# Patient Record
Sex: Female | Born: 1980 | Race: Black or African American | Hispanic: No | Marital: Married | Smoking: Former smoker
Health system: Southern US, Community
[De-identification: ages and names within clinical notes are randomized; demographics above are authoritative.]

## PROBLEM LIST (undated history)

## (undated) ENCOUNTER — Inpatient Hospital Stay (HOSPITAL_COMMUNITY): Payer: Self-pay

## (undated) DIAGNOSIS — O24419 Gestational diabetes mellitus in pregnancy, unspecified control: Secondary | ICD-10-CM

## (undated) DIAGNOSIS — K5100 Ulcerative (chronic) pancolitis without complications: Secondary | ICD-10-CM

## (undated) DIAGNOSIS — K529 Noninfective gastroenteritis and colitis, unspecified: Secondary | ICD-10-CM

## (undated) HISTORY — PX: FOOT SURGERY: SHX648

## (undated) HISTORY — PX: WISDOM TOOTH EXTRACTION: SHX21

## (undated) HISTORY — PX: THERAPEUTIC ABORTION: SHX798

---

## 2001-05-27 ENCOUNTER — Emergency Department (HOSPITAL_COMMUNITY): Admission: EM | Admit: 2001-05-27 | Discharge: 2001-05-27 | Payer: Self-pay

## 2016-09-19 ENCOUNTER — Encounter (HOSPITAL_COMMUNITY): Payer: Self-pay | Admitting: *Deleted

## 2016-09-19 ENCOUNTER — Inpatient Hospital Stay (HOSPITAL_COMMUNITY)
Admission: AD | Admit: 2016-09-19 | Discharge: 2016-09-19 | Disposition: A | Discharge status: Home / Self Care | Payer: Medicaid Other | Source: Ambulatory Visit | Attending: Obstetrics & Gynecology | Admitting: Obstetrics & Gynecology

## 2016-09-19 DIAGNOSIS — O219 Vomiting of pregnancy, unspecified: Secondary | ICD-10-CM | POA: Diagnosis present

## 2016-09-19 DIAGNOSIS — Z3A08 8 weeks gestation of pregnancy: Secondary | ICD-10-CM | POA: Insufficient documentation

## 2016-09-19 DIAGNOSIS — Z3201 Encounter for pregnancy test, result positive: Secondary | ICD-10-CM | POA: Insufficient documentation

## 2016-09-19 LAB — URINALYSIS, ROUTINE W REFLEX MICROSCOPIC
BILIRUBIN URINE: NEGATIVE
Glucose, UA: NEGATIVE mg/dL
Hgb urine dipstick: NEGATIVE
Ketones, ur: 15 mg/dL — AB
Leukocytes, UA: NEGATIVE
NITRITE: NEGATIVE
PROTEIN: NEGATIVE mg/dL
pH: 6 (ref 5.0–8.0)

## 2016-09-19 LAB — POCT PREGNANCY, URINE: PREG TEST UR: POSITIVE — AB

## 2016-09-19 MED ORDER — PROMETHAZINE HCL 25 MG PO TABS: 25.0000 mg | ORAL_TABLET | Freq: Four times a day (QID) | ORAL | 1 refills | Status: DC | PRN

## 2016-09-19 MED ORDER — PROMETHAZINE HCL 25 MG/ML IJ SOLN
25.0000 mg | Freq: Once | INTRAMUSCULAR | Status: AC
  Administered 2016-09-19: 25 mg via INTRAVENOUS
  Filled 2016-09-19: qty 1

## 2016-09-19 MED ORDER — DEXTROSE IN LACTATED RINGERS 5 % IV SOLN
Freq: Once | INTRAVENOUS | Status: AC
  Administered 2016-09-19: 18:00:00 via INTRAVENOUS

## 2016-09-19 NOTE — MAU Provider Note (Signed)
History     CSN: [id number]  Arrival date and time: 09/19/16 1609   None     Chief Complaint  Patient presents with  . Nausea   Subjective:   This 35 y.o. female presents with nausea and vomiting of undigested food.  Onset of symptoms was gradual starting 3 weeks ago with gradually worsening course since that time. Symptoms have been occuring  all day.  She has been trying to eat light meals but she still has nausea and vomiting. She has vomited 12 times in the past 24 hours.  Hasn't been able to keep water down.   There are no active problems to display for this patient.  Past Medical History: No date: Medical history non-contributory  Past Surgical History: No date: FOOT SURGERY No date: THERAPEUTIC ABORTION No date: WISDOM TOOTH EXTRACTION  No prescriptions prior to admission.  No Known Allergies  Smoking status: Never Smoker                                                            Smokeless tobacco: Never Used                     Alcohol use: No             Review of patient's family history indicates:   Diabetes                       Mother                   Diabetes                       Maternal Grandmother     Diabetes                       Maternal Grandfather     Objective:  Patient Vitals in the past 8 hrs: 09/19/16 1725, BP:126/75, Temp:98.4 F (36.9 C), Temp WUJ:WJXBsrc:Oral, Pulse:85, Resp:17 09/19/16 1619, BP:128/82, Temp:98.2 F (36.8 C), Temp JYN:WGNFsrc:Oral, Pulse:107, Resp:16, Height:5\' 2"  (1.575 m), Weight:86.3 kg (190 lb 3.2 oz    OB History    Gravida Para Term Preterm AB Living   4 1 1   2 1    SAB TAB Ectopic Multiple Live Births   1 1     1       Past Medical History:  Diagnosis Date  . Medical history non-contributory     Past Surgical History:  Procedure Laterality Date  . FOOT SURGERY    . THERAPEUTIC ABORTION    . WISDOM TOOTH EXTRACTION      Family History  Problem Relation Age of Onset  . Diabetes Mother   . Diabetes Maternal  Grandmother   . Diabetes Maternal Grandfather     Social History  Substance Use Topics  . Smoking status: Never Smoker  . Smokeless tobacco: Never Used  . Alcohol use No    Allergies: No Known Allergies  No prescriptions prior to admission.    Review of Systems  Constitutional: Negative.   HENT: Negative.   Eyes: Negative.   Respiratory: Negative.   Cardiovascular: Negative.   Gastrointestinal: Positive for heartburn, nausea and vomiting. Negative for abdominal pain, blood in stool,  constipation, diarrhea and melena.  Genitourinary: Negative.   Musculoskeletal: Negative.   Skin: Negative.   Neurological: Negative.   Endo/Heme/Allergies: Negative.    Physical Exam   Blood pressure 126/75, pulse 85, temperature 98.4 F (36.9 C), temperature source Oral, resp. rate 17, height 5\' 2"  (1.575 m), weight 86.3 kg (190 lb 3.2 oz), last menstrual period 07/15/2016.  Physical Exam  Constitutional: She is oriented to person, place, and time. She appears well-developed.  HENT:  Head: Normocephalic and atraumatic.  Neck: Normal range of motion.  Respiratory: Effort normal.  GI: Soft. Bowel sounds are normal. She exhibits no distension and no mass. There is no tenderness. There is no rebound and no guarding.  Musculoskeletal: Normal range of motion.  Neurological: She is alert and oriented to person, place, and time.  Skin: Skin is warm and dry.    MAU Course  Procedures  MDM IV fluids Phenergan   Assessment and Plan  Patient to be discharged home with N/V of pregnancy and recommendations for OTC treatments as well as phenergan prescription.    Charlesetta GaribaldiKathryn Lorraine Vernida Mcnicholas CNM 09/19/2016, 7:25 PM

## 2016-09-19 NOTE — MAU Note (Signed)
Is about 10wks preg.  Has real bad nausea.  [facility], first appt is 12/27

## 2016-09-19 NOTE — Discharge Instructions (Signed)

## 2016-10-17 ENCOUNTER — Ambulatory Visit (INDEPENDENT_AMBULATORY_CARE_PROVIDER_SITE_OTHER): Payer: Medicaid Other | Admitting: Obstetrics and Gynecology

## 2016-10-17 ENCOUNTER — Encounter: Payer: Self-pay | Admitting: *Deleted

## 2016-10-17 ENCOUNTER — Other Ambulatory Visit (HOSPITAL_COMMUNITY)
Admission: RE | Admit: 2016-10-17 | Discharge: 2016-10-17 | Disposition: A | Discharge status: Home / Self Care | Payer: Medicaid Other | Source: Ambulatory Visit | Attending: Obstetrics and Gynecology | Admitting: Obstetrics and Gynecology

## 2016-10-17 DIAGNOSIS — Z1151 Encounter for screening for human papillomavirus (HPV): Secondary | ICD-10-CM | POA: Diagnosis not present

## 2016-10-17 DIAGNOSIS — Z01419 Encounter for gynecological examination (general) (routine) without abnormal findings: Secondary | ICD-10-CM | POA: Insufficient documentation

## 2016-10-17 DIAGNOSIS — Z3491 Encounter for supervision of normal pregnancy, unspecified, first trimester: Secondary | ICD-10-CM

## 2016-10-17 DIAGNOSIS — Z3490 Encounter for supervision of normal pregnancy, unspecified, unspecified trimester: Secondary | ICD-10-CM | POA: Insufficient documentation

## 2016-10-17 MED ORDER — PREPLUS 27-1 MG PO TABS: 1.0000 | ORAL_TABLET | Freq: Every day | ORAL | 13 refills | Status: DC

## 2016-10-17 NOTE — Addendum Note (Signed)
Addended by: Maretta BeesMCGLASHAN, Allyce Bochicchio J on: 10/17/2016 01:57 PM   Modules accepted: Orders

## 2016-10-17 NOTE — Addendum Note (Signed)
Addended by: Marya LandryFOSTER, Pier Laux D on: 10/17/2016 02:01 PM   Modules accepted: Orders

## 2016-10-17 NOTE — Patient Instructions (Signed)
First Trimester of Pregnancy  The first trimester of pregnancy is from week 1 until the end of week 12 (months 1 through 3). A week after a sperm fertilizes an egg, the egg will implant on the wall of the uterus. This embryo will begin to develop into a baby. Genes from you and your partner are forming the baby. The female genes determine whether the baby is a boy or a girl. At 6-8 weeks, the eyes and face are formed, and the heartbeat can be seen on ultrasound. At the end of 12 weeks, all the baby's organs are formed.   Now that you are pregnant, you will want to do everything you can to have a healthy baby. Two of the most important things are to get good prenatal care and to follow your health care provider's instructions. Prenatal care is all the medical care you receive before the baby's birth. This care will help prevent, find, and treat any problems during the pregnancy and childbirth.  BODY CHANGES  Your body goes through many changes during pregnancy. The changes vary from woman to woman.   · You may gain or lose a couple of pounds at first.  · You may feel sick to your stomach (nauseous) and throw up (vomit). If the vomiting is uncontrollable, call your health care provider.  · You may tire easily.  · You may develop headaches that can be relieved by medicines approved by your health care provider.  · You may urinate more often. Painful urination may mean you have a bladder infection.  · You may develop heartburn as a result of your pregnancy.  · You may develop constipation because certain hormones are causing the muscles that push waste through your intestines to slow down.  · You may develop hemorrhoids or swollen, bulging veins (varicose veins).  · Your breasts may begin to grow larger and become tender. Your nipples may stick out more, and the tissue that surrounds them (areola) may become darker.  · Your gums may bleed and may be sensitive to brushing and flossing.   · Dark spots or blotches (chloasma, mask of pregnancy) may develop on your face. This will likely fade after the baby is born.  · Your menstrual periods will stop.  · You may have a loss of appetite.  · You may develop cravings for certain kinds of food.  · You may have changes in your emotions from day to day, such as being excited to be pregnant or being concerned that something may go wrong with the pregnancy and baby.  · You may have more vivid and strange dreams.  · You may have changes in your hair. These can include thickening of your hair, rapid growth, and changes in texture. Some women also have hair loss during or after pregnancy, or hair that feels dry or thin. Your hair will most likely return to normal after your baby is born.  WHAT TO EXPECT AT YOUR PRENATAL VISITS  During a routine prenatal visit:  · You will be weighed to make sure you and the baby are growing normally.  · Your blood pressure will be taken.  · Your abdomen will be measured to track your baby's growth.  · The fetal heartbeat will be listened to starting around week 10 or 12 of your pregnancy.  · Test results from any previous visits will be discussed.  Your health care provider may ask you:  · How you are feeling.  · If you   are feeling the baby move.  · If you have had any abnormal symptoms, such as leaking fluid, bleeding, severe headaches, or abdominal cramping.  · If you are using any tobacco products, including cigarettes, chewing tobacco, and electronic cigarettes.  · If you have any questions.  Other tests that may be performed during your first trimester include:  · Blood tests to find your blood type and to check for the presence of any previous infections. They will also be used to check for low iron levels (anemia) and Rh antibodies. Later in the pregnancy, blood tests for diabetes will be done along with other tests if problems develop.  · Urine tests to check for infections, diabetes, or protein in the urine.   · An ultrasound to confirm the proper growth and development of the baby.  · An amniocentesis to check for possible genetic problems.  · Fetal screens for spina bifida and Down syndrome.  · You may need other tests to make sure you and the baby are doing well.  · HIV (human immunodeficiency virus) testing. Routine prenatal testing includes screening for HIV, unless you choose not to have this test.  HOME CARE INSTRUCTIONS   Medicines  · Follow your health care provider's instructions regarding medicine use. Specific medicines may be either safe or unsafe to take during pregnancy.  · Take your prenatal vitamins as directed.  · If you develop constipation, try taking a stool softener if your health care provider approves.  Diet  · Eat regular, well-balanced meals. Choose a variety of foods, such as meat or vegetable-based protein, fish, milk and low-fat dairy products, vegetables, fruits, and whole grain breads and cereals. Your health care provider will help you determine the amount of weight gain that is right for you.  · Avoid raw meat and uncooked cheese. These carry germs that can cause birth defects in the baby.  · Eating four or five small meals rather than three large meals a day may help relieve nausea and vomiting. If you start to feel nauseous, eating a few soda crackers can be helpful. Drinking liquids between meals instead of during meals also seems to help nausea and vomiting.  · If you develop constipation, eat more high-fiber foods, such as fresh vegetables or fruit and whole grains. Drink enough fluids to keep your urine clear or pale yellow.  Activity and Exercise  · Exercise only as directed by your health care provider. Exercising will help you:    Control your weight.    Stay in shape.    Be prepared for labor and delivery.  · Experiencing pain or cramping in the lower abdomen or low back is a good sign that you should stop exercising. Check with your health care provider  before continuing normal exercises.  · Try to avoid standing for long periods of time. Move your legs often if you must stand in one place for a long time.  · Avoid heavy lifting.  · Wear low-heeled shoes, and practice good posture.  · You may continue to have sex unless your health care provider directs you otherwise.  Relief of Pain or Discomfort  · Wear a good support bra for breast tenderness.    · Take warm sitz baths to soothe any pain or discomfort caused by hemorrhoids. Use hemorrhoid cream if your health care provider approves.    · Rest with your legs elevated if you have leg cramps or low back pain.  · If you develop varicose veins in your   legs, wear support hose. Elevate your feet for 15 minutes, 3-4 times a day. Limit salt in your diet.  Prenatal Care  · Schedule your prenatal visits by the twelfth week of pregnancy. They are usually scheduled monthly at first, then more often in the last 2 months before delivery.  · Write down your questions. Take them to your prenatal visits.  · Keep all your prenatal visits as directed by your health care provider.  Safety  · Wear your seat belt at all times when driving.  · Make a list of emergency phone numbers, including numbers for family, friends, the hospital, and police and fire departments.  General Tips  · Ask your health care provider for a referral to a local prenatal education class. Begin classes no later than at the beginning of month 6 of your pregnancy.  · Ask for help if you have counseling or nutritional needs during pregnancy. Your health care provider can offer advice or refer you to specialists for help with various needs.  · Do not use hot tubs, steam rooms, or saunas.  · Do not douche or use tampons or scented sanitary pads.  · Do not cross your legs for long periods of time.  · Avoid cat litter boxes and soil used by cats. These carry germs that can cause birth defects in the baby and possibly loss of the fetus by miscarriage or stillbirth.   · Avoid all smoking, herbs, alcohol, and medicines not prescribed by your health care provider. Chemicals in these affect the formation and growth of the baby.  · Do not use any tobacco products, including cigarettes, chewing tobacco, and electronic cigarettes. If you need help quitting, ask your health care provider. You may receive counseling support and other resources to help you quit.  · Schedule a dentist appointment. At home, brush your teeth with a soft toothbrush and be gentle when you floss.  SEEK MEDICAL CARE IF:   · You have dizziness.  · You have mild pelvic cramps, pelvic pressure, or nagging pain in the abdominal area.  · You have persistent nausea, vomiting, or diarrhea.  · You have a bad smelling vaginal discharge.  · You have pain with urination.  · You notice increased swelling in your face, hands, legs, or ankles.  SEEK IMMEDIATE MEDICAL CARE IF:   · You have a fever.  · You are leaking fluid from your vagina.  · You have spotting or bleeding from your vagina.  · You have severe abdominal cramping or pain.  · You have rapid weight gain or loss.  · You vomit blood or material that looks like coffee grounds.  · You are exposed to German measles and have never had them.  · You are exposed to fifth disease or chickenpox.  · You develop a severe headache.  · You have shortness of breath.  · You have any kind of trauma, such as from a fall or a car accident.     This information is not intended to replace advice given to you by your health care provider. Make sure you discuss any questions you have with your health care provider.     Document Released: 10/02/2001 Document Revised: 10/29/2014 Document Reviewed: 08/18/2013  Elsevier Interactive Patient Education ©2017 [employer]

## 2016-10-17 NOTE — Progress Notes (Signed)
Subjective:  Veronica Carroll is a 35 y.o. G4P1021 at 10557w3d being seen today for her first OB visit. Dates by LMP. She has some URI Sx. N/V has improved. H/O Chronic [facility] Previously took, Gabapentin and Moxicam. Off these meds. Recommended not medications for back pain if possible during the pregnancy.   She is currently monitored for the following issues for this low-risk pregnancy and has Supervision of normal pregnancy, antepartum on her problem list.  Patient reports no complaints.  Contractions: Not present. Vag. Bleeding: None.  Movement: Absent. Denies leaking of fluid.   The following portions of the patient's history were reviewed and updated as appropriate: allergies, current medications, past family history, past medical history, past social history, past surgical history and problem list. Problem list updated.  Objective:   Vitals:   10/17/16 0838  BP: 130/79  Pulse: 96  Temp: 97 F (36.1 C)  Weight: 185 lb 6.4 oz (84.1 kg)    Fetal Status: Fetal Heart Rate (bpm): 154   Movement: Absent     General:  Alert, oriented and cooperative. Patient is in no acute distress.  Skin: Skin is warm and dry. No rash noted.   Cardiovascular: Normal heart rate noted  Respiratory: Normal respiratory effort, no problems with respiration noted  Abdomen: Soft, gravid, appropriate for gestational age. Pain/Pressure: Absent     Pelvic:  Cervical exam deferred        Extremities: Normal range of motion.     Mental Status: Normal mood and affect. Normal behavior. Normal judgment and thought content.  Breast: sym supple no nipple d/c masses or adenopathy  Urinalysis: Urine Protein: Trace Urine Glucose: Negative  Assessment and Plan:  Pregnancy: G4P1021 at 4657w3d  1. Encounter for supervision of normal pregnancy, antepartum, unspecified gravidity Prenatal care reviewed. Schedule anatomy scan at next visit Quad screen if desires at next visit Declines flu vaccine -  Culture, OB Urine - ToxASSURE Select 13 (MW), Urine - Cystic Fibrosis Mutation 97 - Hemoglobinopathy evaluation - Varicella zoster antibody, IgG - Obstetric Panel, Including HIV - Cytology - PAP - NuSwab Vaginitis Plus (VG+) - Comprehensive metabolic panel - HgB A1c  Preterm labor symptoms and general obstetric precautions including but not limited to vaginal bleeding, contractions, leaking of fluid and fetal movement were reviewed in detail with the patient. Please refer to After Visit Summary for other counseling recommendations.  Return in about 4 weeks (around 11/14/2016) for OB visit.   Hermina StaggersMichael L Ervin, MD

## 2016-10-18 LAB — COMPREHENSIVE METABOLIC PANEL
A/G RATIO: 1.3 (ref 1.2–2.2)
ALBUMIN: 3.8 g/dL (ref 3.5–5.5)
ALT: 6 IU/L (ref 0–32)
AST: 9 IU/L (ref 0–40)
Alkaline Phosphatase: 69 IU/L (ref 39–117)
BUN / CREAT RATIO: 6 — AB (ref 9–23)
BUN: 5 mg/dL — ABNORMAL LOW (ref 6–20)
Bilirubin Total: <0.2 mg/dL (ref 0.0–1.2)
CALCIUM: 8.8 mg/dL (ref 8.7–10.2)
CO2: 20 mmol/L (ref 18–29)
Chloride: 102 mmol/L (ref 96–106)
Creatinine, Ser: 0.82 mg/dL (ref 0.57–1.00)
GFR, EST AFRICAN AMERICAN: 106 mL/min/{1.73_m2} (ref >59)
GFR, EST NON AFRICAN AMERICAN: 92 mL/min/{1.73_m2} (ref >59)
GLOBULIN, TOTAL: 3 g/dL (ref 1.5–4.5)
Glucose: 104 mg/dL — ABNORMAL HIGH (ref 65–99)
POTASSIUM: 3.9 mmol/L (ref 3.5–5.2)
SODIUM: 139 mmol/L (ref 134–144)
TOTAL PROTEIN: 6.8 g/dL (ref 6.0–8.5)

## 2016-10-18 LAB — CYTOLOGY - PAP
Diagnosis: NEGATIVE
HPV (WINDOPATH): NOT DETECTED

## 2016-10-20 LAB — NUSWAB VAGINITIS PLUS (VG+)
ATOPOBIUM VAGINAE: HIGH {score} — AB
BVAB 2: HIGH {score} — AB
Candida albicans, NAA: NEGATIVE
Candida glabrata, NAA: NEGATIVE
Chlamydia trachomatis, NAA: NEGATIVE
Megasphaera 1: HIGH Score — AB
Neisseria gonorrhoeae, NAA: NEGATIVE
Trich vag by NAA: NEGATIVE

## 2016-10-21 ENCOUNTER — Inpatient Hospital Stay (HOSPITAL_COMMUNITY)
Admission: AD | Admit: 2016-10-21 | Discharge: 2016-10-21 | Disposition: A | Discharge status: Home / Self Care | Payer: Medicaid Other | Source: Ambulatory Visit | Attending: Obstetrics and Gynecology | Admitting: Obstetrics and Gynecology

## 2016-10-21 ENCOUNTER — Encounter (HOSPITAL_COMMUNITY): Payer: Self-pay

## 2016-10-21 DIAGNOSIS — Z3A14 14 weeks gestation of pregnancy: Secondary | ICD-10-CM | POA: Diagnosis not present

## 2016-10-21 DIAGNOSIS — E876 Hypokalemia: Secondary | ICD-10-CM

## 2016-10-21 DIAGNOSIS — J329 Chronic sinusitis, unspecified: Secondary | ICD-10-CM | POA: Diagnosis not present

## 2016-10-21 DIAGNOSIS — O210 Mild hyperemesis gravidarum: Secondary | ICD-10-CM | POA: Diagnosis present

## 2016-10-21 DIAGNOSIS — O219 Vomiting of pregnancy, unspecified: Secondary | ICD-10-CM | POA: Diagnosis not present

## 2016-10-21 DIAGNOSIS — O99512 Diseases of the respiratory system complicating pregnancy, second trimester: Secondary | ICD-10-CM | POA: Insufficient documentation

## 2016-10-21 DIAGNOSIS — O211 Hyperemesis gravidarum with metabolic disturbance: Secondary | ICD-10-CM | POA: Insufficient documentation

## 2016-10-21 DIAGNOSIS — E860 Dehydration: Secondary | ICD-10-CM

## 2016-10-21 LAB — URINALYSIS, ROUTINE W REFLEX MICROSCOPIC
BILIRUBIN URINE: NEGATIVE
Glucose, UA: NEGATIVE mg/dL
Hgb urine dipstick: NEGATIVE
KETONES UR: 80 mg/dL — AB
Nitrite: NEGATIVE
Protein, ur: 30 mg/dL — AB
SPECIFIC GRAVITY, URINE: 1.026 (ref 1.005–1.030)
pH: 5 (ref 5.0–8.0)

## 2016-10-21 LAB — COMPREHENSIVE METABOLIC PANEL
ALBUMIN: 3.2 g/dL — AB (ref 3.5–5.0)
ALT: 9 U/L — AB (ref 14–54)
AST: 14 U/L — AB (ref 15–41)
Alkaline Phosphatase: 61 U/L (ref 38–126)
Anion gap: 9 (ref 5–15)
BUN: 7 mg/dL (ref 6–20)
CHLORIDE: 104 mmol/L (ref 101–111)
CO2: 19 mmol/L — AB (ref 22–32)
CREATININE: 0.72 mg/dL (ref 0.44–1.00)
Calcium: 8.9 mg/dL (ref 8.9–10.3)
GFR calc Af Amer: >60 mL/min (ref >60)
GFR calc non Af Amer: >60 mL/min (ref >60)
GLUCOSE: 139 mg/dL — AB (ref 65–99)
Potassium: 2.9 mmol/L — ABNORMAL LOW (ref 3.5–5.1)
SODIUM: 132 mmol/L — AB (ref 135–145)
Total Bilirubin: 0.2 mg/dL — ABNORMAL LOW (ref 0.3–1.2)
Total Protein: 7 g/dL (ref 6.5–8.1)

## 2016-10-21 LAB — CBC
HCT: 31.9 % — ABNORMAL LOW (ref 36.0–46.0)
Hemoglobin: 10.8 g/dL — ABNORMAL LOW (ref 12.0–15.0)
MCH: 26.5 pg (ref 26.0–34.0)
MCHC: 33.9 g/dL (ref 30.0–36.0)
MCV: 78.2 fL (ref 78.0–100.0)
PLATELETS: 246 10*3/uL (ref 150–400)
RBC: 4.08 MIL/uL (ref 3.87–5.11)
RDW: 13.8 % (ref 11.5–15.5)
WBC: 5.5 10*3/uL (ref 4.0–10.5)

## 2016-10-21 MED ORDER — POTASSIUM CHLORIDE 20 MEQ PO PACK
20.0000 meq | PACK | Freq: Once | ORAL | Status: DC
  Filled 2016-10-21: qty 1

## 2016-10-21 MED ORDER — POTASSIUM CHLORIDE 20 MEQ/15ML (10%) PO SOLN
20.0000 meq | Freq: Once | ORAL | Status: AC
Start: 2016-10-21 — End: 2016-10-21
  Administered 2016-10-21: 20 meq via ORAL
  Filled 2016-10-21: qty 15

## 2016-10-21 MED ORDER — AMOXICILLIN-POT CLAVULANATE 875-125 MG PO TABS: 1.0000 | ORAL_TABLET | Freq: Two times a day (BID) | ORAL | 0 refills | Status: DC

## 2016-10-21 MED ORDER — SODIUM CHLORIDE 0.9 % IV SOLN
8.0000 mg | Freq: Once | INTRAVENOUS | Status: AC
  Administered 2016-10-21: 8 mg via INTRAVENOUS
  Filled 2016-10-21: qty 4

## 2016-10-21 MED ORDER — DEXTROSE 5 % IN LACTATED RINGERS IV BOLUS
1000.0000 mL | Freq: Once | INTRAVENOUS | Status: AC
  Administered 2016-10-21: 1000 mL via INTRAVENOUS

## 2016-10-21 MED ORDER — POTASSIUM CHLORIDE ER 10 MEQ PO TBCR: 10.0000 meq | EXTENDED_RELEASE_TABLET | Freq: Two times a day (BID) | ORAL | 0 refills | Status: DC

## 2016-10-21 MED ORDER — ONDANSETRON 4 MG PO TBDP: 4.0000 mg | ORAL_TABLET | Freq: Three times a day (TID) | ORAL | 0 refills | Status: DC | PRN

## 2016-10-21 NOTE — MAU Note (Signed)
Patient presents with vomiting the entire 1st trimester, today had blood in bowel movement, onset of congestion since Christmas has productive sputum yellow to green.

## 2016-10-21 NOTE — MAU Provider Note (Signed)
History     CSN: [id number]  Arrival date and time: 10/21/16 1454   First Provider Initiated Contact with Patient 10/21/16 1624      Chief Complaint  Patient presents with  . Facial Pain  . Morning Sickness  . Nasal Congestion   HPI  Veronica Carroll is a 35 y.o. G4P1021 at 4252w0d who presents with n/v, sinus pain, & congestion.  Nausea & vomiting has been ongoing during pregnancy. Patient has phenergan at home. Last took yesterday. Has vomited 4 times today; mostly bile with some streaks of blood today. Denies diarrhea, constipation, abdominal pain, or heartburn.  Congestion & sinus pain since Christmas Day. Reports rhinorrhea, nasal congestion, sinus pain. Has noted thick yellow mucous when she blows her nose. PND is exacerbating nausea. Denies fever/chills, sore throat, ear pain, or cough. Has taken sudafed without relief. No sick contacts.   OB History    Gravida Para Term Preterm AB Living   4 1 1   2 1    SAB TAB Ectopic Multiple Live Births   1 1     1       Past Medical History:  Diagnosis Date  . Medical history non-contributory     Past Surgical History:  Procedure Laterality Date  . FOOT SURGERY    . THERAPEUTIC ABORTION    . WISDOM TOOTH EXTRACTION      Family History  Problem Relation Age of Onset  . Diabetes Mother   . Diabetes Maternal Grandmother   . Diabetes Maternal Grandfather     Social History  Substance Use Topics  . Smoking status: Never Smoker  . Smokeless tobacco: Never Used  . Alcohol use No    Allergies: No Known Allergies  Prescriptions Prior to Admission  Medication Sig Dispense Refill Last Dose  . Prenatal Vit-Fe Fumarate-FA (PREPLUS) 27-1 MG TABS Take 1 tablet by mouth daily. 30 tablet 13   . promethazine (PHENERGAN) 25 MG tablet Take 1 tablet (25 mg total) by mouth every 6 (six) hours as needed for nausea or vomiting. 30 tablet 1 Taking    Review of Systems  Constitutional: Negative for chills and fever.  HENT: Positive for  congestion and sinus pain. Negative for ear pain and sore throat.   Respiratory: Negative for cough.   Gastrointestinal: Positive for nausea and vomiting. Negative for abdominal pain, constipation and diarrhea.  Genitourinary: Negative.   Musculoskeletal: Negative for neck pain.  Neurological: Positive for headaches.   Physical Exam   Blood pressure 121/78, pulse 75, temperature 99.4 F (37.4 C), temperature source Oral, resp. rate 18, height 5\' 1"  (1.549 m), weight 186 lb (84.4 kg), last menstrual period 07/15/2016.  Physical Exam  Nursing note and vitals reviewed. Constitutional: She is oriented to person, place, and time. She appears well-developed and well-nourished. No distress.  HENT:  Head: Normocephalic and atraumatic.  Right Ear: Tympanic membrane normal.  Left Ear: Tympanic membrane normal.  Nose: Mucosal edema and rhinorrhea present. Right sinus exhibits maxillary sinus tenderness. Right sinus exhibits no frontal sinus tenderness. Left sinus exhibits maxillary sinus tenderness. Left sinus exhibits no frontal sinus tenderness.  Mouth/Throat: Oropharynx is clear and moist.  PND noted in posterior oropharynx  Eyes: Conjunctivae are normal. Right eye exhibits no discharge. Left eye exhibits no discharge. No scleral icterus.  Neck: Normal range of motion.  Cardiovascular: Normal rate, regular rhythm and normal heart sounds.   No murmur heard. Respiratory: Effort normal and breath sounds normal. No respiratory distress. She has no wheezes.  GI: Soft.  Neurological: She is alert and oriented to person, place, and time.  Skin: Skin is warm and dry. She is not diaphoretic.  Psychiatric: She has a normal mood and affect. Her behavior is normal. Judgment and thought content normal.    MAU Course  Procedures Results for orders placed or performed during the hospital encounter of 10/21/16 (from the past 24 hour(s))  Urinalysis, Routine w reflex microscopic     Status: Abnormal    Collection Time: 10/21/16  3:12 PM  Result Value Ref Range   Color, Urine YELLOW YELLOW   APPearance HAZY (A) CLEAR   Specific Gravity, Urine 1.026 1.005 - 1.030   pH 5.0 5.0 - 8.0   Glucose, UA NEGATIVE NEGATIVE mg/dL   Hgb urine dipstick NEGATIVE NEGATIVE   Bilirubin Urine NEGATIVE NEGATIVE   Ketones, ur 80 (A) NEGATIVE mg/dL   Protein, ur 30 (A) NEGATIVE mg/dL   Nitrite NEGATIVE NEGATIVE   Leukocytes, UA TRACE (A) NEGATIVE   RBC / HPF 0-5 0 - 5 RBC/hpf   WBC, UA 6-30 0 - 5 WBC/hpf   Bacteria, UA RARE (A) NONE SEEN   Squamous Epithelial / LPF 6-30 (A) NONE SEEN   Mucous PRESENT   CBC     Status: Abnormal   Collection Time: 10/21/16  5:06 PM  Result Value Ref Range   WBC 5.5 4.0 - 10.5 K/uL   RBC 4.08 3.87 - 5.11 MIL/uL   Hemoglobin 10.8 (L) 12.0 - 15.0 g/dL   HCT 82.931.9 (L) 56.236.0 - 13.046.0 %   MCV 78.2 78.0 - 100.0 fL   MCH 26.5 26.0 - 34.0 pg   MCHC 33.9 30.0 - 36.0 g/dL   RDW 86.513.8 78.411.5 - 69.615.5 %   Platelets 246 150 - 400 K/uL  Comprehensive metabolic panel     Status: Abnormal   Collection Time: 10/21/16  5:06 PM  Result Value Ref Range   Sodium 132 (L) 135 - 145 mmol/L   Potassium 2.9 (L) 3.5 - 5.1 mmol/L   Chloride 104 101 - 111 mmol/L   CO2 19 (L) 22 - 32 mmol/L   Glucose, Bld 139 (H) 65 - 99 mg/dL   BUN 7 6 - 20 mg/dL   Creatinine, Ser 2.950.72 0.44 - 1.00 mg/dL   Calcium 8.9 8.9 - 28.410.3 mg/dL   Total Protein 7.0 6.5 - 8.1 g/dL   Albumin 3.2 (L) 3.5 - 5.0 g/dL   AST 14 (L) 15 - 41 U/L   ALT 9 (L) 14 - 54 U/L   Alkaline Phosphatase 61 38 - 126 U/L   Total Bilirubin 0.2 (L) 0.3 - 1.2 mg/dL   GFR calc non Af Amer >60 >60 mL/min   GFR calc Af Amer >60 >60 mL/min   Anion gap 9 5 - 15    MDM FHT 150 by doppler U/a shows 80 ketones --- IV hydration with D5LR Zofran 8 mg IV CBC, CMP -- hypokalemia with a K of 2.9 -- 20 meq of kdur given in MAU -- will send home with course of kdur Able to tolerate POs  Assessment and Plan  A; 1. Nausea and vomiting during pregnancy  prior to [redacted] weeks gestation   2. Dehydration   3. Hypokalemia   4. Rhinosinusitis    P: Discharge home Rx zofran, kdur, & augmentin Keep f/u with OB Discussed use of OTC meds for symptoms -- recommend cetirizine & fluticasone Discussed reasons to return to MAU  Judeth HornErin Zayden Maffei 10/21/2016, 4:24 PM

## 2016-10-21 NOTE — Discharge Instructions (Signed)
Sinusitis, Adult Sinusitis is soreness and inflammation of your sinuses. Sinuses are hollow spaces in the bones around your face. They are located:  Around your eyes.  In the middle of your forehead.  Behind your nose.  In your cheekbones. Your sinuses and nasal passages are lined with a stringy fluid (mucus). Mucus normally drains out of your sinuses. When your nasal tissues get inflamed or swollen, the mucus can get trapped or blocked so air cannot flow through your sinuses. This lets bacteria, viruses, and funguses grow, and that leads to infection. Follow these instructions at home: Medicines  Take, use, or apply over-the-counter and prescription medicines only as told by your doctor. These may include nasal sprays.  If you were prescribed an antibiotic medicine, take it as told by your doctor. Do not stop taking the antibiotic even if you start to feel better. Hydrate and Humidify  Drink enough water to keep your pee (urine) clear or pale yellow.  Use a cool mist humidifier to keep the humidity level in your home above 50%.  Breathe in steam for 10-15 minutes, 3-4 times a day or as told by your doctor. You can do this in the bathroom while a hot shower is running.  Try not to spend time in cool or dry air. Rest  Rest as much as possible.  Sleep with your head raised (elevated).  Make sure to get enough sleep each night. General instructions  Put a warm, moist washcloth on your face 3-4 times a day or as told by your doctor. This will help with discomfort.  Wash your hands often with soap and water. If there is no soap and water, use hand sanitizer.  Do not smoke. Avoid being around people who are smoking (secondhand smoke).  Keep all follow-up visits as told by your doctor. This is important. Contact a doctor if:  You have a fever.  Your symptoms get worse.  Your symptoms do not get better within 10 days. Get help right away if:  You have a very bad  headache.  You cannot stop throwing up (vomiting).  You have pain or swelling around your face or eyes.  You have trouble seeing.  You feel confused.  Your neck is stiff.  You have trouble breathing. This information is not intended to replace advice given to you by your health care provider. Make sure you discuss any questions you have with your health care provider. Document Released: 03/26/2008 Document Revised: 06/03/2016 Document Reviewed: 08/03/2015 Elsevier Interactive Patient Education  2017 [employer] Hypokalemia Hypokalemia means that the amount of potassium in the blood is lower than normal.Potassium is a chemical that helps regulate the amount of fluid in the body (electrolyte). It also stimulates muscle tightening (contraction) and helps nerves work properly.Normally, most of the bodys potassium is inside of cells, and only a very small amount is in the blood. Because the amount in the blood is so small, minor changes to potassium levels in the blood can be life-threatening. What are the causes? This condition may be caused by:  Antibiotic medicine.  Diarrhea or vomiting. Taking too much of a medicine that helps you have a bowel movement (laxative) can cause diarrhea and lead to hypokalemia.  Chronic kidney disease (CKD).  Medicines that help the body get rid of excess fluid (diuretics).  Eating disorders, such as bulimia.  Low magnesium levels in the body.  Sweating a lot. What are the signs or symptoms? Symptoms of this condition include:  Weakness.  Constipation.  Fatigue.  Muscle cramps.  Mental confusion.  Skipped heartbeats or irregular heartbeat (palpitations).  Tingling or numbness. How is this diagnosed? This condition is diagnosed with a blood test. How is this treated? Hypokalemia can be treated by taking potassium supplements by mouth or adjusting the medicines that you take. Treatment may also include eating more foods that contain  a lot of potassium. If your potassium level is very low, you may need to get potassium through an IV tube in one of your veins and be monitored in the hospital. Follow these instructions at home:  Take over-the-counter and prescription medicines only as told by your health care provider. This includes vitamins and supplements.  Eat a healthy diet. A healthy diet includes fresh fruits and vegetables, whole grains, healthy fats, and lean proteins.  If instructed, eat more foods that contain a lot of potassium, such as:  Nuts, such as peanuts and pistachios.  Seeds, such as sunflower seeds and pumpkin seeds.  Peas, lentils, and lima beans.  Whole grain and bran cereals and breads.  Fresh fruits and vegetables, such as apricots, avocado, bananas, cantaloupe, kiwi, oranges, tomatoes, asparagus, and potatoes.  Orange juice.  Tomato juice.  Red meats.  Yogurt.  Keep all follow-up visits as told by your health care provider. This is important. Contact a health care provider if:  You have weakness that gets worse.  You feel your heart pounding or racing.  You vomit.  You have diarrhea.  You have diabetes (diabetes mellitus) and you have trouble keeping your blood sugar (glucose) in your target range. Get help right away if:  You have chest pain.  You have shortness of breath.  You have vomiting or diarrhea that lasts for more than 2 days.  You faint. This information is not intended to replace advice given to you by your health care provider. Make sure you discuss any questions you have with your health care provider. Document Released: 10/08/2005 Document Revised: 05/26/2016 Document Reviewed: 05/26/2016 Elsevier Interactive Patient Education  2017 [employer]. Morning Sickness Morning sickness is when you feel sick to your stomach (nauseous) during pregnancy. This nauseous feeling may or may not come with vomiting. It often occurs in the morning but can be a problem any  time of day. Morning sickness is most common during the first trimester, but it may continue throughout pregnancy. While morning sickness is unpleasant, it is usually harmless unless you develop severe and continual vomiting (hyperemesis gravidarum). This condition requires more intense treatment. What are the causes? The cause of morning sickness is not completely known but seems to be related to normal hormonal changes that occur in pregnancy. What increases the risk? You are at greater risk if you:  Experienced nausea or vomiting before your pregnancy.  Had morning sickness during a previous pregnancy.  Are pregnant with more than one baby, such as twins. How is this treated? Do not use any medicines (prescription, over-the-counter, or herbal) for morning sickness without first talking to your health care provider. Your health care provider may prescribe or recommend:  Vitamin B6 supplements.  Anti-nausea medicines.  The herbal medicine ginger. Follow these instructions at home:  Only take over-the-counter or prescription medicines as directed by your health care provider.  Taking multivitamins before getting pregnant can prevent or decrease the severity of morning sickness in most women.  Eat a piece of dry toast or unsalted crackers before getting out of bed in the morning.  Eat five or six small  meals a day.  Eat dry and bland foods (rice, baked potato). Foods high in carbohydrates are often helpful.  Do not drink liquids with your meals. Drink liquids between meals.  Avoid greasy, fatty, and spicy foods.  Get someone to cook for you if the smell of any food causes nausea and vomiting.  If you feel nauseous after taking prenatal vitamins, take the vitamins at night or with a snack.  Snack on protein foods (nuts, yogurt, cheese) between meals if you are hungry.  Eat unsweetened gelatins for desserts.  Wearing an acupressure wristband (worn for sea sickness) may be  helpful.  Acupuncture may be helpful.  Do not smoke.  Get a humidifier to keep the air in your house free of odors.  Get plenty of fresh air. Contact a health care provider if:  Your home remedies are not working, and you need medicine.  You feel dizzy or lightheaded.  You are losing weight. Get help right away if:  You have persistent and uncontrolled nausea and vomiting.  You pass out (faint). This information is not intended to replace advice given to you by your health care provider. Make sure you discuss any questions you have with your health care provider. Document Released: 11/29/2006 Document Revised: 03/15/2016 Document Reviewed: 03/25/2013 Elsevier Interactive Patient Education  2017 [employer].

## 2016-10-22 LAB — URINE CULTURE, OB REFLEX

## 2016-10-22 LAB — CULTURE, OB URINE

## 2016-10-22 NOTE — L&D Delivery Note (Signed)
Delivery Note At 6:07 AM a viable female was delivered via  (Presentation: vertex ROA  ).  APGAR:9 ,9 ; weight  pending.   Placenta status: delivered intact with gentle traction.  Cord: 3 vessel  with the following complications: none .  Cord pH: not collected  Anesthesia: none Episiotomy:  none Lacerations:  2nd degree Est. Blood Loss (mL):  150  Mom to postpartum.  Baby to Couplet care / Skin to Skin.  Veronica Carroll 04/04/2017, 6:28 AM

## 2016-10-25 LAB — OBSTETRIC PANEL, INCLUDING HIV
ANTIBODY SCREEN: NEGATIVE
BASOS: 0 %
Basophils Absolute: 0 10*3/uL (ref 0.0–0.2)
EOS (ABSOLUTE): 0.1 10*3/uL (ref 0.0–0.4)
EOS: 2 %
HEMATOCRIT: 35.9 % (ref 34.0–46.6)
HEMOGLOBIN: 11.6 g/dL (ref 11.1–15.9)
HIV SCREEN 4TH GENERATION: NONREACTIVE
Hepatitis B Surface Ag: NEGATIVE
IMMATURE GRANS (ABS): 0 10*3/uL (ref 0.0–0.1)
Immature Granulocytes: 0 %
Lymphocytes Absolute: 1.6 10*3/uL (ref 0.7–3.1)
Lymphs: 30 %
MCH: 25.8 pg — ABNORMAL LOW (ref 26.6–33.0)
MCHC: 32.3 g/dL (ref 31.5–35.7)
MCV: 80 fL (ref 79–97)
MONOCYTES: 10 %
Monocytes Absolute: 0.6 10*3/uL (ref 0.1–0.9)
NEUTROS ABS: 3.2 10*3/uL (ref 1.4–7.0)
Neutrophils: 58 %
Platelets: 240 10*3/uL (ref 150–379)
RBC: 4.5 x10E6/uL (ref 3.77–5.28)
RDW: 14.8 % (ref 12.3–15.4)
RH TYPE: NEGATIVE
RPR Ser Ql: NONREACTIVE
RUBELLA: 5.6 {index} (ref >0.99)
WBC: 5.6 10*3/uL (ref 3.4–10.8)

## 2016-10-25 LAB — HEMOGLOBIN A1C
Est. average glucose Bld gHb Est-mCnc: 114 mg/dL
HEMOGLOBIN A1C: 5.6 % (ref 4.8–5.6)

## 2016-10-25 LAB — HEMOGLOBINOPATHY EVALUATION
HGB A: 97.7 % (ref 96.4–98.8)
HGB C: 0 %
HGB S: 0 %
Hemoglobin A2 Quantitation: 2.3 % (ref 1.8–3.2)
Hemoglobin F Quantitation: 0 % (ref 0.0–2.0)

## 2016-10-25 LAB — VARICELLA ZOSTER ANTIBODY, IGG: VARICELLA: 3486 {index} (ref >165)

## 2016-10-25 LAB — TOXASSURE SELECT 13 (MW), URINE

## 2016-10-25 LAB — CYSTIC FIBROSIS MUTATION 97: GENE DIS ANAL CARRIER INTERP BLD/T-IMP: NOT DETECTED

## 2016-11-13 ENCOUNTER — Ambulatory Visit (INDEPENDENT_AMBULATORY_CARE_PROVIDER_SITE_OTHER): Payer: Medicaid Other | Admitting: Obstetrics and Gynecology

## 2016-11-13 DIAGNOSIS — Z3492 Encounter for supervision of normal pregnancy, unspecified, second trimester: Secondary | ICD-10-CM | POA: Diagnosis not present

## 2016-11-13 DIAGNOSIS — E876 Hypokalemia: Secondary | ICD-10-CM

## 2016-11-13 DIAGNOSIS — Z3490 Encounter for supervision of normal pregnancy, unspecified, unspecified trimester: Secondary | ICD-10-CM

## 2016-11-13 NOTE — Patient Instructions (Signed)
Second Trimester of Pregnancy The second trimester is from week 13 through week 28 (months 4 through 6). The second trimester is often a time when you feel your best. Your body has also adjusted to being pregnant, and you begin to feel better physically. Usually, morning sickness has lessened or quit completely, you may have more energy, and you may have an increase in appetite. The second trimester is also a time when the fetus is growing rapidly. At the end of the sixth month, the fetus is about 9 inches long and weighs about 1 pounds. You will likely begin to feel the baby move (quickening) between 18 and 20 weeks of the pregnancy. Body changes during your second trimester Your body continues to go through many changes during your second trimester. The changes vary from woman to woman.  Your weight will continue to increase. You will notice your lower abdomen bulging out.  You may begin to get stretch marks on your hips, abdomen, and breasts.  You may develop headaches that can be relieved by medicines. The medicines should be approved by your health care provider.  You may urinate more often because the fetus is pressing on your bladder.  You may develop or continue to have heartburn as a result of your pregnancy.  You may develop constipation because certain hormones are causing the muscles that push waste through your intestines to slow down.  You may develop hemorrhoids or swollen, bulging veins (varicose veins).  You may have back pain. This is caused by:  Weight gain.  Pregnancy hormones that are relaxing the joints in your pelvis.  A shift in weight and the muscles that support your balance.  Your breasts will continue to grow and they will continue to become tender.  Your gums may bleed and may be sensitive to brushing and flossing.  Dark spots or blotches (chloasma, mask of pregnancy) may develop on your face. This will likely fade after the baby is born.  A dark line  from your belly button to the pubic area (linea nigra) may appear. This will likely fade after the baby is born.  You may have changes in your hair. These can include thickening of your hair, rapid growth, and changes in texture. Some women also have hair loss during or after pregnancy, or hair that feels dry or thin. Your hair will most likely return to normal after your baby is born. What to expect at prenatal visits During a routine prenatal visit:  You will be weighed to make sure you and the fetus are growing normally.  Your blood pressure will be taken.  Your abdomen will be measured to track your baby's growth.  The fetal heartbeat will be listened to.  Any test results from the previous visit will be discussed. Your health care provider may ask you:  How you are feeling.  If you are feeling the baby move.  If you have had any abnormal symptoms, such as leaking fluid, bleeding, severe headaches, or abdominal cramping.  If you are using any tobacco products, including cigarettes, chewing tobacco, and electronic cigarettes.  If you have any questions. Other tests that may be performed during your second trimester include:  Blood tests that check for:  Low iron levels (anemia).  Gestational diabetes (between 24 and 28 weeks).  Rh antibodies. This is to check for a protein on red blood cells (Rh factor).  Urine tests to check for infections, diabetes, or protein in the urine.  An ultrasound to   confirm the proper growth and development of the baby.  An amniocentesis to check for possible genetic problems.  Fetal screens for spina bifida and Down syndrome.  HIV (human immunodeficiency virus) testing. Routine prenatal testing includes screening for HIV, unless you choose not to have this test. Follow these instructions at home: Eating and drinking  Continue to eat regular, healthy meals.  Avoid raw meat, uncooked cheese, cat litter boxes, and soil used by cats. These  carry germs that can cause birth defects in the baby.  Take your prenatal vitamins.  Take 1500-2000 mg of calcium daily starting at the 20th week of pregnancy until you deliver your baby.  If you develop constipation:  Take over-the-counter or prescription medicines.  Drink enough fluid to keep your urine clear or pale yellow.  Eat foods that are high in fiber, such as fresh fruits and vegetables, whole grains, and beans.  Limit foods that are high in fat and processed sugars, such as fried and sweet foods. Activity  Exercise only as directed by your health care provider. Experiencing uterine cramps is a good sign to stop exercising.  Avoid heavy lifting, wear low heel shoes, and practice good posture.  Wear your seat belt at all times when driving.  Rest with your legs elevated if you have leg cramps or low back pain.  Wear a good support bra for breast tenderness.  Do not use hot tubs, steam rooms, or saunas. Lifestyle  Avoid all smoking, herbs, alcohol, and unprescribed drugs. These chemicals affect the formation and growth of the baby.  Do not use any products that contain nicotine or tobacco, such as cigarettes and e-cigarettes. If you need help quitting, ask your health care provider.  A sexual relationship may be continued unless your health care provider directs you otherwise. General instructions  Follow your health care provider's instructions regarding medicine use. There are medicines that are either safe or unsafe to take during pregnancy.  Take warm sitz baths to soothe any pain or discomfort caused by hemorrhoids. Use hemorrhoid cream if your health care provider approves.  If you develop varicose veins, wear support hose. Elevate your feet for 15 minutes, 3-4 times a day. Limit salt in your diet.  Visit your dentist if you have not gone yet during your pregnancy. Use a soft toothbrush to brush your teeth and be gentle when you floss.  Keep all follow-up  prenatal visits as told by your health care provider. This is important. Contact a health care provider if:  You have dizziness.  You have mild pelvic cramps, pelvic pressure, or nagging pain in the abdominal area.  You have persistent nausea, vomiting, or diarrhea.  You have a bad smelling vaginal discharge.  You have pain with urination. Get help right away if:  You have a fever.  You are leaking fluid from your vagina.  You have spotting or bleeding from your vagina.  You have severe abdominal cramping or pain.  You have rapid weight gain or weight loss.  You have shortness of breath with chest pain.  You notice sudden or extreme swelling of your face, hands, ankles, feet, or legs.  You have not felt your baby move in over an hour.  You have severe headaches that do not go away with medicine.  You have vision changes. Summary  The second trimester is from week 13 through week 28 (months 4 through 6). It is also a time when the fetus is growing rapidly.  Your body goes   through many changes during pregnancy. The changes vary from woman to woman.  Avoid all smoking, herbs, alcohol, and unprescribed drugs. These chemicals affect the formation and growth your baby.  Do not use any tobacco products, such as cigarettes, chewing tobacco, and e-cigarettes. If you need help quitting, ask your health care provider.  Contact your health care provider if you have any questions. Keep all prenatal visits as told by your health care provider. This is important. This information is not intended to replace advice given to you by your health care provider. Make sure you discuss any questions you have with your health care provider. Document Released: 10/02/2001 Document Revised: 03/15/2016 Document Reviewed: 12/09/2012 Elsevier Interactive Patient Education  2017 Elsevier Inc.  

## 2016-11-13 NOTE — Progress Notes (Signed)
Subjective:  Veronica Carroll is a 36 y.o. G4P1021 at 3169w2d being seen today for ongoing prenatal care.  She is currently monitored for the following issues for this low-risk pregnancy and has Supervision of normal pregnancy, antepartum and Hypokalemia on her problem list.  Patient reports no complaints.  Contractions: Not present. Vag. Bleeding: None.   . Denies leaking of fluid.   The following portions of the patient's history were reviewed and updated as appropriate: allergies, current medications, past family history, past medical history, past social history, past surgical history and problem list. Problem list updated.  Objective:   Vitals:   11/13/16 1117  BP: 127/80  Pulse: 86  Weight: 186 lb (84.4 kg)    Fetal Status:           General:  Alert, oriented and cooperative. Patient is in no acute distress.  Skin: Skin is warm and dry. No rash noted.   Cardiovascular: Normal heart rate noted  Respiratory: Normal respiratory effort, no problems with respiration noted  Abdomen: Soft, gravid, appropriate for gestational age. Pain/Pressure: Present     Pelvic:  Cervical exam deferred        Extremities: Normal range of motion.  Edema: Trace  Mental Status: Normal mood and affect. Normal behavior. Normal judgment and thought content.   Urinalysis:      Assessment and Plan:  Pregnancy: G4P1021 at 2869w2d  1. Encounter for supervision of normal pregnancy, antepartum, unspecified gravidity Quad screen today U/S scheduled  2. Hypokalemia Check BMP today N/V has resolved  Preterm labor symptoms and general obstetric precautions including but not limited to vaginal bleeding, contractions, leaking of fluid and fetal movement were reviewed in detail with the patient. Please refer to After Visit Summary for other counseling recommendations.  No Follow-up on file.   Hermina StaggersMichael L Saraia Platner, MD

## 2016-11-14 ENCOUNTER — Encounter: Payer: Medicaid Other | Admitting: Obstetrics & Gynecology

## 2016-11-17 LAB — BASIC METABOLIC PANEL
BUN / CREAT RATIO: 8 — AB (ref 9–23)
BUN: 6 mg/dL (ref 6–20)
CO2: 18 mmol/L (ref 18–29)
CREATININE: 0.72 mg/dL (ref 0.57–1.00)
Calcium: 9.2 mg/dL (ref 8.7–10.2)
Chloride: 102 mmol/L (ref 96–106)
GFR calc Af Amer: 124 mL/min/{1.73_m2} (ref >59)
GFR calc non Af Amer: 107 mL/min/{1.73_m2} (ref >59)
GLUCOSE: 98 mg/dL (ref 65–99)
Potassium: 4 mmol/L (ref 3.5–5.2)
SODIUM: 138 mmol/L (ref 134–144)

## 2016-11-17 LAB — AFP, QUAD SCREEN
DIA Mom Value: 0.45
DIA Value (EIA): 68.65 pg/mL
DSR (By Age)    1 IN: 163
DSR (SECOND TRIMESTER) 1 IN: 4629
GESTATIONAL AGE AFP: 17.3 wk
MSAFP MOM: 0.96
MSAFP: 36.2 ng/mL
MSHCG Mom: 0.58
MSHCG: 15273 m[IU]/mL
Maternal Age At EDD: 37.6 YEARS
OSB RISK: 10000
TEST RESULTS AFP: NEGATIVE
WEIGHT: 186 [lb_av]
uE3 Mom: 0.95
uE3 Value: 1.01 ng/mL

## 2016-11-22 ENCOUNTER — Encounter (HOSPITAL_COMMUNITY): Payer: Self-pay | Admitting: Obstetrics and Gynecology

## 2016-11-28 ENCOUNTER — Other Ambulatory Visit: Payer: Self-pay | Admitting: Obstetrics and Gynecology

## 2016-11-28 ENCOUNTER — Ambulatory Visit (HOSPITAL_COMMUNITY)
Admission: RE | Admit: 2016-11-28 | Discharge: 2016-11-28 | Disposition: A | Discharge status: Home / Self Care | Payer: Medicaid Other | Source: Ambulatory Visit | Attending: Obstetrics and Gynecology | Admitting: Obstetrics and Gynecology

## 2016-11-28 DIAGNOSIS — O09522 Supervision of elderly multigravida, second trimester: Secondary | ICD-10-CM | POA: Insufficient documentation

## 2016-11-28 DIAGNOSIS — Z3A19 19 weeks gestation of pregnancy: Secondary | ICD-10-CM

## 2016-11-28 DIAGNOSIS — Z3490 Encounter for supervision of normal pregnancy, unspecified, unspecified trimester: Secondary | ICD-10-CM

## 2016-11-28 DIAGNOSIS — O358XX0 Maternal care for other (suspected) fetal abnormality and damage, not applicable or unspecified: Secondary | ICD-10-CM

## 2016-11-28 DIAGNOSIS — O35EXX0 Maternal care for other (suspected) fetal abnormality and damage, fetal genitourinary anomalies, not applicable or unspecified: Secondary | ICD-10-CM

## 2016-12-11 ENCOUNTER — Ambulatory Visit (INDEPENDENT_AMBULATORY_CARE_PROVIDER_SITE_OTHER): Payer: Medicaid Other | Admitting: Obstetrics and Gynecology

## 2016-12-11 DIAGNOSIS — O358XX0 Maternal care for other (suspected) fetal abnormality and damage, not applicable or unspecified: Secondary | ICD-10-CM | POA: Insufficient documentation

## 2016-12-11 DIAGNOSIS — O09529 Supervision of elderly multigravida, unspecified trimester: Secondary | ICD-10-CM | POA: Insufficient documentation

## 2016-12-11 DIAGNOSIS — O09522 Supervision of elderly multigravida, second trimester: Secondary | ICD-10-CM

## 2016-12-11 DIAGNOSIS — O35EXX0 Maternal care for other (suspected) fetal abnormality and damage, fetal genitourinary anomalies, not applicable or unspecified: Secondary | ICD-10-CM

## 2016-12-11 NOTE — Progress Notes (Signed)
Subjective:  Veronica Carroll is a 36 y.o. G4P1021 at [redacted]w[redacted]d being seen today for ongoing prenatal care.  She is currently monitored for the following issues for this high-risk pregnancy and has Supervision of normal pregnancy, antepartum; AMA (advanced maternal age) multigravida 35+; and Pyelectasis of fetus on prenatal ultrasound on her problem list.  Patient reports no complaints.  Contractions: Not present. Vag. Bleeding: None.  Movement: Present. Denies leaking of fluid.   The following portions of the patient's history were reviewed and updated as appropriate: allergies, current medications, past family history, past medical history, past social history, past surgical history and problem list. Problem list updated.  Objective:   Vitals:   12/11/16 1035  BP: 120/73  Pulse: (!) 106  Weight: 192 lb (87.1 kg)    Fetal Status: Fetal Heart Rate (bpm): 150   Movement: Present     General:  Alert, oriented and cooperative. Patient is in no acute distress.  Skin: Skin is warm and dry. No rash noted.   Cardiovascular: Normal heart rate noted  Respiratory: Normal respiratory effort, no problems with respiration noted  Abdomen: Soft, gravid, appropriate for gestational age. Pain/Pressure: Absent     Pelvic:  Cervical exam deferred        Extremities: Normal range of motion.  Edema: None  Mental Status: Normal mood and affect. Normal behavior. Normal judgment and thought content.   Urinalysis:      Assessment and Plan:  Pregnancy: G4P1021 at 3838w2d  1. Elderly multigravida in second trimester Quad screen negative Declines any further genetic testing at this time  2. Pyelectasis of fetus on prenatal ultrasound F/U U/S scheduled  Preterm labor symptoms and general obstetric precautions including but not limited to vaginal bleeding, contractions, leaking of fluid and fetal movement were reviewed in detail with the patient. Please refer to After Visit Summary for other counseling  recommendations.  Return in about 4 weeks (around 01/08/2017) for OB visit.   Hermina StaggersMichael L Ervin, MD

## 2016-12-11 NOTE — Patient Instructions (Signed)
Second Trimester of Pregnancy The second trimester is from week 13 through week 28 (months 4 through 6). The second trimester is often a time when you feel your best. Your body has also adjusted to being pregnant, and you begin to feel better physically. Usually, morning sickness has lessened or quit completely, you may have more energy, and you may have an increase in appetite. The second trimester is also a time when the fetus is growing rapidly. At the end of the sixth month, the fetus is about 9 inches long and weighs about 1 pounds. You will likely begin to feel the baby move (quickening) between 18 and 20 weeks of the pregnancy. Body changes during your second trimester Your body continues to go through many changes during your second trimester. The changes vary from woman to woman.  Your weight will continue to increase. You will notice your lower abdomen bulging out.  You may begin to get stretch marks on your hips, abdomen, and breasts.  You may develop headaches that can be relieved by medicines. The medicines should be approved by your health care provider.  You may urinate more often because the fetus is pressing on your bladder.  You may develop or continue to have heartburn as a result of your pregnancy.  You may develop constipation because certain hormones are causing the muscles that push waste through your intestines to slow down.  You may develop hemorrhoids or swollen, bulging veins (varicose veins).  You may have back pain. This is caused by:  Weight gain.  Pregnancy hormones that are relaxing the joints in your pelvis.  A shift in weight and the muscles that support your balance.  Your breasts will continue to grow and they will continue to become tender.  Your gums may bleed and may be sensitive to brushing and flossing.  Dark spots or blotches (chloasma, mask of pregnancy) may develop on your face. This will likely fade after the baby is born.  A dark line  from your belly button to the pubic area (linea nigra) may appear. This will likely fade after the baby is born.  You may have changes in your hair. These can include thickening of your hair, rapid growth, and changes in texture. Some women also have hair loss during or after pregnancy, or hair that feels dry or thin. Your hair will most likely return to normal after your baby is born. What to expect at prenatal visits During a routine prenatal visit:  You will be weighed to make sure you and the fetus are growing normally.  Your blood pressure will be taken.  Your abdomen will be measured to track your baby's growth.  The fetal heartbeat will be listened to.  Any test results from the previous visit will be discussed. Your health care provider may ask you:  How you are feeling.  If you are feeling the baby move.  If you have had any abnormal symptoms, such as leaking fluid, bleeding, severe headaches, or abdominal cramping.  If you are using any tobacco products, including cigarettes, chewing tobacco, and electronic cigarettes.  If you have any questions. Other tests that may be performed during your second trimester include:  Blood tests that check for:  Low iron levels (anemia).  Gestational diabetes (between 24 and 28 weeks).  Rh antibodies. This is to check for a protein on red blood cells (Rh factor).  Urine tests to check for infections, diabetes, or protein in the urine.  An ultrasound to   confirm the proper growth and development of the baby.  An amniocentesis to check for possible genetic problems.  Fetal screens for spina bifida and Down syndrome.  HIV (human immunodeficiency virus) testing. Routine prenatal testing includes screening for HIV, unless you choose not to have this test. Follow these instructions at home: Eating and drinking  Continue to eat regular, healthy meals.  Avoid raw meat, uncooked cheese, cat litter boxes, and soil used by cats. These  carry germs that can cause birth defects in the baby.  Take your prenatal vitamins.  Take 1500-2000 mg of calcium daily starting at the 20th week of pregnancy until you deliver your baby.  If you develop constipation:  Take over-the-counter or prescription medicines.  Drink enough fluid to keep your urine clear or pale yellow.  Eat foods that are high in fiber, such as fresh fruits and vegetables, whole grains, and beans.  Limit foods that are high in fat and processed sugars, such as fried and sweet foods. Activity  Exercise only as directed by your health care provider. Experiencing uterine cramps is a good sign to stop exercising.  Avoid heavy lifting, wear low heel shoes, and practice good posture.  Wear your seat belt at all times when driving.  Rest with your legs elevated if you have leg cramps or low back pain.  Wear a good support bra for breast tenderness.  Do not use hot tubs, steam rooms, or saunas. Lifestyle  Avoid all smoking, herbs, alcohol, and unprescribed drugs. These chemicals affect the formation and growth of the baby.  Do not use any products that contain nicotine or tobacco, such as cigarettes and e-cigarettes. If you need help quitting, ask your health care provider.  A sexual relationship may be continued unless your health care provider directs you otherwise. General instructions  Follow your health care provider's instructions regarding medicine use. There are medicines that are either safe or unsafe to take during pregnancy.  Take warm sitz baths to soothe any pain or discomfort caused by hemorrhoids. Use hemorrhoid cream if your health care provider approves.  If you develop varicose veins, wear support hose. Elevate your feet for 15 minutes, 3-4 times a day. Limit salt in your diet.  Visit your dentist if you have not gone yet during your pregnancy. Use a soft toothbrush to brush your teeth and be gentle when you floss.  Keep all follow-up  prenatal visits as told by your health care provider. This is important. Contact a health care provider if:  You have dizziness.  You have mild pelvic cramps, pelvic pressure, or nagging pain in the abdominal area.  You have persistent nausea, vomiting, or diarrhea.  You have a bad smelling vaginal discharge.  You have pain with urination. Get help right away if:  You have a fever.  You are leaking fluid from your vagina.  You have spotting or bleeding from your vagina.  You have severe abdominal cramping or pain.  You have rapid weight gain or weight loss.  You have shortness of breath with chest pain.  You notice sudden or extreme swelling of your face, hands, ankles, feet, or legs.  You have not felt your baby move in over an hour.  You have severe headaches that do not go away with medicine.  You have vision changes. Summary  The second trimester is from week 13 through week 28 (months 4 through 6). It is also a time when the fetus is growing rapidly.  Your body goes   through many changes during pregnancy. The changes vary from woman to woman.  Avoid all smoking, herbs, alcohol, and unprescribed drugs. These chemicals affect the formation and growth your baby.  Do not use any tobacco products, such as cigarettes, chewing tobacco, and e-cigarettes. If you need help quitting, ask your health care provider.  Contact your health care provider if you have any questions. Keep all prenatal visits as told by your health care provider. This is important. This information is not intended to replace advice given to you by your health care provider. Make sure you discuss any questions you have with your health care provider. Document Released: 10/02/2001 Document Revised: 03/15/2016 Document Reviewed: 12/09/2012 Elsevier Interactive Patient Education  2017 Elsevier Inc.  

## 2016-12-11 NOTE — Progress Notes (Signed)
Patient has questions about the [place] - genetic questions

## 2017-01-09 ENCOUNTER — Encounter (HOSPITAL_COMMUNITY): Payer: Self-pay

## 2017-01-09 ENCOUNTER — Ambulatory Visit (HOSPITAL_COMMUNITY)
Admission: RE | Admit: 2017-01-09 | Discharge: 2017-01-09 | Disposition: A | Discharge status: Home / Self Care | Payer: Medicaid Other | Source: Ambulatory Visit | Attending: Obstetrics and Gynecology | Admitting: Obstetrics and Gynecology

## 2017-01-09 ENCOUNTER — Ambulatory Visit (INDEPENDENT_AMBULATORY_CARE_PROVIDER_SITE_OTHER): Payer: Medicaid Other | Admitting: Obstetrics and Gynecology

## 2017-01-09 VITALS — BP 103/67 | HR 93 | Wt 190.0 lb

## 2017-01-09 DIAGNOSIS — G8929 Other chronic pain: Secondary | ICD-10-CM | POA: Insufficient documentation

## 2017-01-09 DIAGNOSIS — O09522 Supervision of elderly multigravida, second trimester: Secondary | ICD-10-CM | POA: Diagnosis not present

## 2017-01-09 DIAGNOSIS — M549 Dorsalgia, unspecified: Secondary | ICD-10-CM | POA: Diagnosis not present

## 2017-01-09 DIAGNOSIS — O358XX0 Maternal care for other (suspected) fetal abnormality and damage, not applicable or unspecified: Secondary | ICD-10-CM

## 2017-01-09 DIAGNOSIS — O283 Abnormal ultrasonic finding on antenatal screening of mother: Secondary | ICD-10-CM | POA: Diagnosis not present

## 2017-01-09 DIAGNOSIS — O35EXX0 Maternal care for other (suspected) fetal abnormality and damage, fetal genitourinary anomalies, not applicable or unspecified: Secondary | ICD-10-CM

## 2017-01-09 DIAGNOSIS — Z3480 Encounter for supervision of other normal pregnancy, unspecified trimester: Secondary | ICD-10-CM

## 2017-01-09 DIAGNOSIS — Z3A25 25 weeks gestation of pregnancy: Secondary | ICD-10-CM | POA: Insufficient documentation

## 2017-01-09 DIAGNOSIS — Z362 Encounter for other antenatal screening follow-up: Secondary | ICD-10-CM | POA: Diagnosis not present

## 2017-01-09 DIAGNOSIS — O9989 Other specified diseases and conditions complicating pregnancy, childbirth and the puerperium: Secondary | ICD-10-CM | POA: Diagnosis not present

## 2017-01-09 NOTE — Patient Instructions (Signed)

## 2017-01-09 NOTE — Progress Notes (Signed)
Subjective:  Veronica Carroll is a 36 y.o. G4P1021 at 2553w3d being seen today for ongoing prenatal care.  She is currently monitored for the following issues for this high-risk pregnancy and has Supervision of normal pregnancy, antepartum; AMA (advanced maternal age) multigravida 35+; Pyelectasis of fetus on prenatal ultrasound; and Chronic back pain on her problem list.  Patient reports no complaints.  Contractions: Not present. Vag. Bleeding: None.  Movement: Present. Denies leaking of fluid.   The following portions of the patient's history were reviewed and updated as appropriate: allergies, current medications, past family history, past medical history, past social history, past surgical history and problem list. Problem list updated.  Objective:   Vitals:   01/09/17 1052  BP: 103/67  Pulse: 93  Weight: 190 lb (86.2 kg)    Fetal Status: Fetal Heart Rate (bpm): 145   Movement: Present     General:  Alert, oriented and cooperative. Patient is in no acute distress.  Skin: Skin is warm and dry. No rash noted.   Cardiovascular: Normal heart rate noted  Respiratory: Normal respiratory effort, no problems with respiration noted  Abdomen: Soft, gravid, appropriate for gestational age. Pain/Pressure: Present     Pelvic:  Cervical exam deferred        Extremities: Normal range of motion.     Mental Status: Normal mood and affect. Normal behavior. Normal judgment and thought content.   Urinalysis:      Assessment and Plan:  Pregnancy: G4P1021 at 2053w3d  1. Pyelectasis of fetus on prenatal ultrasound Stable remains A1 on U/S today F/U scan scheduledd  2. Elderly multigravida in second trimester Negative quad  3. Supervision of other normal pregnancy, antepartum Stable Glucola next visit Considering IUD   4. Chronic back pain, unspecified back location, unspecified back pain laterality [facility] by [facility] Scheduled for back brace fitting  Preterm labor symptoms and general obstetric  precautions including but not limited to vaginal bleeding, contractions, leaking of fluid and fetal movement were reviewed in detail with the patient. Please refer to After Visit Summary for other counseling recommendations.  Return in about 3 weeks (around 01/30/2017).   Hermina StaggersMichael L Rama Sorci, MD

## 2017-01-09 NOTE — Progress Notes (Signed)
Pt states lower pelvic pain and back pain.  Pt was seen by PCP and needs to know if she can take Sumatriptan 100mg  and use of Sodium Chloride nasal spray.  Pt had u/s this morning and is scheduled for follow up in 6 weeks.

## 2017-01-10 ENCOUNTER — Other Ambulatory Visit (HOSPITAL_COMMUNITY): Payer: Self-pay | Admitting: *Deleted

## 2017-01-10 DIAGNOSIS — O35EXX0 Maternal care for other (suspected) fetal abnormality and damage, fetal genitourinary anomalies, not applicable or unspecified: Secondary | ICD-10-CM

## 2017-01-10 DIAGNOSIS — O358XX0 Maternal care for other (suspected) fetal abnormality and damage, not applicable or unspecified: Secondary | ICD-10-CM

## 2017-01-30 ENCOUNTER — Other Ambulatory Visit: Payer: Medicaid Other

## 2017-01-30 ENCOUNTER — Encounter: Payer: Medicaid Other | Admitting: Obstetrics and Gynecology

## 2017-01-31 ENCOUNTER — Other Ambulatory Visit: Payer: Medicaid Other

## 2017-01-31 DIAGNOSIS — O0993 Supervision of high risk pregnancy, unspecified, third trimester: Secondary | ICD-10-CM

## 2017-02-01 ENCOUNTER — Other Ambulatory Visit: Payer: Medicaid Other

## 2017-02-01 LAB — CBC
HEMATOCRIT: 28.7 % — AB (ref 34.0–46.6)
HEMOGLOBIN: 9.5 g/dL — AB (ref 11.1–15.9)
MCH: 25.7 pg — ABNORMAL LOW (ref 26.6–33.0)
MCHC: 33.1 g/dL (ref 31.5–35.7)
MCV: 78 fL — ABNORMAL LOW (ref 79–97)
Platelets: 241 10*3/uL (ref 150–379)
RBC: 3.69 x10E6/uL — AB (ref 3.77–5.28)
RDW: 15.6 % — ABNORMAL HIGH (ref 12.3–15.4)
WBC: 5.9 10*3/uL (ref 3.4–10.8)

## 2017-02-01 LAB — GLUCOSE TOLERANCE, 2 HOURS W/ 1HR
Glucose, 1 hour: 205 mg/dL — ABNORMAL HIGH (ref 65–179)
Glucose, 2 hour: 165 mg/dL — ABNORMAL HIGH (ref 65–152)
Glucose, Fasting: 91 mg/dL (ref 65–91)

## 2017-02-01 LAB — HIV ANTIBODY (ROUTINE TESTING W REFLEX): HIV SCREEN 4TH GENERATION: NONREACTIVE

## 2017-02-01 LAB — RPR: RPR: NONREACTIVE

## 2017-02-04 ENCOUNTER — Other Ambulatory Visit: Payer: Self-pay

## 2017-02-04 ENCOUNTER — Ambulatory Visit (INDEPENDENT_AMBULATORY_CARE_PROVIDER_SITE_OTHER): Payer: Medicaid Other | Admitting: Obstetrics and Gynecology

## 2017-02-04 VITALS — BP 124/78 | HR 105 | Wt 196.2 lb

## 2017-02-04 DIAGNOSIS — O24419 Gestational diabetes mellitus in pregnancy, unspecified control: Secondary | ICD-10-CM | POA: Insufficient documentation

## 2017-02-04 DIAGNOSIS — O09523 Supervision of elderly multigravida, third trimester: Secondary | ICD-10-CM | POA: Diagnosis not present

## 2017-02-04 DIAGNOSIS — O358XX0 Maternal care for other (suspected) fetal abnormality and damage, not applicable or unspecified: Secondary | ICD-10-CM | POA: Diagnosis not present

## 2017-02-04 DIAGNOSIS — O35EXX0 Maternal care for other (suspected) fetal abnormality and damage, fetal genitourinary anomalies, not applicable or unspecified: Secondary | ICD-10-CM

## 2017-02-04 DIAGNOSIS — Z3480 Encounter for supervision of other normal pregnancy, unspecified trimester: Secondary | ICD-10-CM

## 2017-02-04 DIAGNOSIS — O24913 Unspecified diabetes mellitus in pregnancy, third trimester: Secondary | ICD-10-CM | POA: Diagnosis not present

## 2017-02-04 MED ORDER — ACCU-CHEK GUIDE W/DEVICE KIT: 1.0000 | PACK | Freq: Four times a day (QID) | 0 refills | Status: DC

## 2017-02-04 MED ORDER — GLUCOSE BLOOD VI STRP
ORAL_STRIP | 12 refills | Status: DC
Start: 2017-02-04 — End: 2017-04-03

## 2017-02-04 MED ORDER — ACCU-CHEK FASTCLIX LANCETS MISC
1.0000 | Freq: Four times a day (QID) | 12 refills | Status: DC
Start: 2017-02-04 — End: 2017-04-03

## 2017-02-04 MED ORDER — BLOOD GLUCOSE METER KIT: PACK | 0 refills | Status: DC

## 2017-02-04 NOTE — Progress Notes (Signed)
   PRENATAL VISIT NOTE  Subjective:  Veronica Carroll is a 36 y.o. G4P1021 at [redacted]w[redacted]d being seen today for ongoing prenatal care.  She is currently monitored for the following issues for this high-risk pregnancy and has Supervision of normal pregnancy, antepartum; AMA (advanced maternal age) multigravida 35+; Pyelectasis of fetus on prenatal ultrasound; Chronic back pain; and Gestational diabetes mellitus (GDM) affecting pregnancy, antepartum on her problem list.  Patient reports lower extremity edema bilaterally and restless leg syndrome.  Contractions: Not present. Vag. Bleeding: None.  Movement: Present. Denies leaking of fluid.   The following portions of the patient's history were reviewed and updated as appropriate: allergies, current medications, past family history, past medical history, past social history, past surgical history and problem list. Problem list updated.  Objective:   Vitals:   02/04/17 1349  BP: 124/78  Pulse: (!) 105  Weight: 196 lb 3.2 oz (89 kg)    Fetal Status: Fetal Heart Rate (bpm): 141 Fundal Height: 29 cm Movement: Present     General:  Alert, oriented and cooperative. Patient is in no acute distress.  Skin: Skin is warm and dry. No rash noted.   Cardiovascular: Normal heart rate noted  Respiratory: Normal respiratory effort, no problems with respiration noted  Abdomen: Soft, gravid, appropriate for gestational age. Pain/Pressure: Absent     Pelvic:  Cervical exam deferred        Extremities: Normal range of motion.  Edema: None  Mental Status: Normal mood and affect. Normal behavior. Normal judgment and thought content.   Assessment and Plan:  Pregnancy: [id number] at [redacted]w[redacted]d  1. Elderly multigravida in third trimester Declined NIPS- normal quad screen  2. Supervision of other normal pregnancy, antepartum Patient is doing well Encouraged the use of pressure stockings and physical activity to minimize edema - Referral to Nutrition and Diabetes  Services  3. Pyelectasis of fetus on prenatal ultrasound Follow up growth on 5/7  4. Gestational diabetes mellitus (GDM) affecting pregnancy, antepartum Patient informed of diagnosis today Reviewed risks associated with poorly controlled GDM in pregnancy including but not limited to risks of fetal macrosomia, shoulder dystocia, risk of cesarean section, neonatal hypoglycemia, IUFD/neonatal demise. Patient verbalized understanding Referral to diabetic educator made and testing supplies provided - Referral to Nutrition and Diabetes Services  Preterm labor symptoms and general obstetric precautions including but not limited to vaginal bleeding, contractions, leaking of fluid and fetal movement were reviewed in detail with the patient. Please refer to After Visit Summary for other counseling recommendations.  No Follow-up on file.   Catalina Antigua, MD

## 2017-02-04 NOTE — Progress Notes (Signed)
Patient is experiencing restless leg syndrome. She is concerned about her GDM diagnosis.

## 2017-02-04 NOTE — Patient Instructions (Signed)
Gestational Diabetes Mellitus, Diagnosis Gestational diabetes (gestational diabetes mellitus) is a temporary form of diabetes that some women develop during pregnancy. It usually occurs around weeks 24-28 of pregnancy and goes away after delivery. Hormonal changes during pregnancy can interfere with insulin production and function, which may result in one or both of these problems:  The pancreas does not make enough of a hormone called insulin.  Cells in the body do not respond properly to insulin that the body makes (insulin resistance).  Normally, insulin allows sugars (glucose) to enter cells in the body. The cells use glucose for energy. Insulin resistance or lack of insulin causes excess glucose to build up in the blood instead of going into cells. As a result, high blood glucose (hyperglycemia) develops. What are the risks? If gestational diabetes is treated, it is unlikely to cause problems. If it is not controlled with treatment, it may cause problems during labor and delivery, and some of those problems can be harmful to the unborn baby (fetus) and the mother. Uncontrolled gestational diabetes may also cause the newborn baby to have breathing problems and low blood glucose. Women who get gestational diabetes are more likely to develop it if they get pregnant again, and they are more likely to develop type 2 diabetes in the future. What increases the risk? This condition may be more likely to develop in pregnant women who:  Are older than age 25 during pregnancy.  Have a family history of diabetes.  Are overweight.  Had gestational diabetes in the past.  Have polycystic ovarian syndrome (PCOS).  Are pregnant with twins or multiples.  Are of American-Indian, African-American, Hispanic/Latino, or Asian/Pacific Islander descent.  What are the signs or symptoms? Most women do not notice symptoms of gestational diabetes because the symptoms are similar to normal symptoms of pregnancy.  Symptoms of gestational diabetes may include:  Increased thirst (polydipsia).  Increased hunger(polyphagia).  Increased urination (polyuria).  How is this diagnosed?  This condition may be diagnosed based on your blood glucose level, which may be checked with one or more of the following blood tests:  A fasting blood glucose (FBG) test. You will not be allowed to eat (you will fast) for at least 8 hours before a blood sample is taken.  A random blood glucose test. This checks your blood glucose at any time of day regardless of when you ate.  An oral glucose tolerance test (OGTT). This is usually done during weeks 24-28 of pregnancy. ? For this test, you will have an FBG test done. Then, you will drink a beverage that contains glucose. Your blood glucose will be tested again 1 hour after drinking the glucose beverage (1-hour OGTT). ? If the 1-hour OGTT result is at or above 140 mg/dL (7.8 mmol/L), you will repeat the OGTT. This time, your blood glucose will be tested 3 hours after drinking the glucose beverage (3-hour OGTT).  If you have risk factors, you may be screened for undiagnosed type 2 diabetes at your first health care visit during your pregnancy (prenatal visit). How is this treated?  Your treatment may be managed by a specialist called an endocrinologist. This condition is treated by following instructions from your health care provider about:  Eating a healthier diet and getting more physical activity. These changes are the most important ways to manage gestational diabetes.  Checking your blood glucose. Do this as often as told.  Taking diabetes medicines or insulin every day. These will only be prescribed if they are   needed. ? If you use insulin, you may need to adjust your dosage based on how physically active you are and what foods you eat. Your health care provider will tell you how to do this.  Your health care provider will set treatment goals for you based on the  stage of your pregnancy and any other medical conditions you have. Generally, the goal of treatment is to maintain the following blood glucose levels during pregnancy:  Fasting: at or below 95 mg/dL (5.3 mmol/L).  After meals (postprandial): ? One hour after a meal: at or below 140 mg/dL (7.8 mmol/L). ? Two hours after a meal: at or below 120 mg/dL (6.7 mmol/L).  A1c (hemoglobin A1c) level: 6-6.5%.  Follow these instructions at home:  Take over-the-counter and prescription medicines only as told by your health care provider.  Manage your weight gain during pregnancy. The amount of weight that you are expected to gain depends on your pre-pregnancy BMI (body mass index).  Keep all follow-up visits as told by your health care provider. This is important. Consider asking your health care provider these questions:   Do I need to meet with a diabetes educator?  Where can I find a support group for people with diabetes?  What equipment will I need to manage my diabetes at home?  What diabetes medicines do I need, and when should I take them?  How often do I need to check my blood glucose?  What number can I call if I have questions?  When is my next appointment? Where to find more information:  For more information about diabetes, visit: ? American Diabetes Association (ADA): www.diabetes.org ? American Association of Diabetes Educators (AADE): www.diabeteseducator.org/patient-resources Contact a health care provider if:  Your blood glucose level is at or above 240 mg/dL (13.3 mmol/L).  Your blood glucose level is at or above 200 mg/dL (11.1 mmol/L) and you have ketones in your urine.  You have been sick or have had a fever for 2 days or more and you are not getting better.  You have any of the following problems for more than 6 hours: ? You cannot eat or drink. ? You have nausea and vomiting. ? You have diarrhea. Get help right away if:  Your blood glucose is below 54  mg/dL (3 mmol/L).  You become confused or you have trouble thinking clearly.  You have difficulty breathing.  You have moderate or large ketone levels in your urine.  Your baby is moving around less than usual.  You develop unusual discharge or bleeding from your vagina.  You start having contractions early (prematurely). Contractions may feel like a tightening in your lower abdomen. This information is not intended to replace advice given to you by your health care provider. Make sure you discuss any questions you have with your health care provider. Document Released: 01/14/2001 Document Revised: 03/15/2016 Document Reviewed: 11/11/2015 Elsevier Interactive Patient Education  2017 Elsevier Inc.  

## 2017-02-13 ENCOUNTER — Encounter: Payer: Medicaid Other | Attending: Obstetrics and Gynecology | Admitting: Registered"

## 2017-02-13 DIAGNOSIS — Z3A00 Weeks of gestation of pregnancy not specified: Secondary | ICD-10-CM | POA: Insufficient documentation

## 2017-02-13 DIAGNOSIS — R7309 Other abnormal glucose: Secondary | ICD-10-CM

## 2017-02-13 DIAGNOSIS — O24919 Unspecified diabetes mellitus in pregnancy, unspecified trimester: Secondary | ICD-10-CM | POA: Diagnosis not present

## 2017-02-14 ENCOUNTER — Encounter: Payer: Self-pay | Admitting: Registered"

## 2017-02-14 NOTE — Progress Notes (Signed)
Patient was seen on 02/13/2017 for Gestational Diabetes self-management . The following learning objectives were met by the patient :   States the definition of Gestational Diabetes  States why dietary management is important in controlling blood glucose  Describes the effects of carbohydrates on blood glucose levels  Demonstrates ability to create a balanced meal plan  Demonstrates carbohydrate counting   States when to check blood glucose levels  Demonstrates proper blood glucose monitoring techniques  States the effect of stress and exercise on blood glucose levels  States the importance of limiting caffeine and abstaining from alcohol and smoking  Plan:  Aim for 3 Carb Choices per meal (45 grams) +/- 1 either way  Aim for 1-2 Carbs per snack Begin reading food labels for Total Carbohydrate of foods Consider  increasing your activity level by walking or other activity daily as tolerated Begin checking BG before breakfast and 2 hours after first bite of breakfast, lunch and dinner as directed by MD  Bring Log Book to every medical appointment   Take medication if directed by MD  Patient already has a meter Blood glucose reading: 138 mg/dl  Patient instructed to test pre breakfast and 2 hours each meal as directed by MD  Patient instructed to monitor glucose levels: FBS: 60 - <90 2 hour: <120 1 hour: <140 ([facility] office)  Patient received the following handouts:  Nutrition Diabetes and Pregnancy  Carbohydrate Counting List  Patient will be seen for follow-up as needed.

## 2017-02-20 ENCOUNTER — Other Ambulatory Visit (HOSPITAL_COMMUNITY): Payer: Self-pay | Admitting: Maternal and Fetal Medicine

## 2017-02-20 ENCOUNTER — Encounter (HOSPITAL_COMMUNITY): Payer: Self-pay

## 2017-02-20 ENCOUNTER — Ambulatory Visit (INDEPENDENT_AMBULATORY_CARE_PROVIDER_SITE_OTHER): Payer: Medicaid Other | Admitting: Obstetrics and Gynecology

## 2017-02-20 ENCOUNTER — Ambulatory Visit (HOSPITAL_COMMUNITY)
Admission: RE | Admit: 2017-02-20 | Discharge: 2017-02-20 | Disposition: A | Discharge status: Home / Self Care | Payer: Medicaid Other | Source: Ambulatory Visit | Attending: Obstetrics and Gynecology | Admitting: Obstetrics and Gynecology

## 2017-02-20 VITALS — BP 127/73 | HR 107 | Wt 196.0 lb

## 2017-02-20 DIAGNOSIS — O09523 Supervision of elderly multigravida, third trimester: Secondary | ICD-10-CM

## 2017-02-20 DIAGNOSIS — Z3480 Encounter for supervision of other normal pregnancy, unspecified trimester: Secondary | ICD-10-CM

## 2017-02-20 DIAGNOSIS — O358XX0 Maternal care for other (suspected) fetal abnormality and damage, not applicable or unspecified: Secondary | ICD-10-CM | POA: Diagnosis not present

## 2017-02-20 DIAGNOSIS — Z3A31 31 weeks gestation of pregnancy: Secondary | ICD-10-CM | POA: Insufficient documentation

## 2017-02-20 DIAGNOSIS — O0993 Supervision of high risk pregnancy, unspecified, third trimester: Secondary | ICD-10-CM

## 2017-02-20 DIAGNOSIS — O35EXX0 Maternal care for other (suspected) fetal abnormality and damage, fetal genitourinary anomalies, not applicable or unspecified: Secondary | ICD-10-CM

## 2017-02-20 DIAGNOSIS — O24919 Unspecified diabetes mellitus in pregnancy, unspecified trimester: Secondary | ICD-10-CM

## 2017-02-20 DIAGNOSIS — O24419 Gestational diabetes mellitus in pregnancy, unspecified control: Secondary | ICD-10-CM

## 2017-02-20 MED ORDER — PROMETHAZINE HCL 25 MG PO TABS: 25.0000 mg | ORAL_TABLET | Freq: Four times a day (QID) | ORAL | 1 refills | Status: DC | PRN

## 2017-02-20 NOTE — Progress Notes (Signed)
Subjective:  Veronica Carroll is a 36 y.o. G4P1021 at [redacted]w[redacted]d being seen today for ongoing prenatal care.  She is currently monitored for the following issues for this high-risk pregnancy and has Supervision of normal pregnancy, antepartum; AMA (advanced maternal age) multigravida 35+; Pyelectasis of fetus on prenatal ultrasound; Chronic back pain; and Gestational diabetes mellitus (GDM) affecting pregnancy, antepartum on her problem list.  Patient reports no complaints.  Contractions: Not present. Vag. Bleeding: None.  Movement: Present. Denies leaking of fluid.   The following portions of the patient's history were reviewed and updated as appropriate: allergies, current medications, past family history, past medical history, past social history, past surgical history and problem list. Problem list updated.  Objective:   Vitals:   02/20/17 1314  BP: 127/73  Pulse: (!) 107  Weight: 196 lb (88.9 kg)    Fetal Status: Fetal Heart Rate (bpm): 149   Movement: Present     General:  Alert, oriented and cooperative. Patient is in no acute distress.  Skin: Skin is warm and dry. No rash noted.   Cardiovascular: Normal heart rate noted  Respiratory: Normal respiratory effort, no problems with respiration noted  Abdomen: Soft, gravid, appropriate for gestational age. Pain/Pressure: Present     Pelvic:  Cervical exam deferred        Extremities: Normal range of motion.  Edema: Trace  Mental Status: Normal mood and affect. Normal behavior. Normal judgment and thought content.   Urinalysis:      Assessment and Plan:  Pregnancy: G4P1021 at [redacted]w[redacted]d  1. Gestational diabetes mellitus (GDM) affecting pregnancy, antepartum Saw DM educator last week. BS are in goal range for the most part. Elevated ones related to diet choice. Reviewed with pt  2. Supervision of other normal pregnancy, antepartum Stable  3. Elderly multigravida in third trimester Nl Quad screen, declined NIPS  4. Pyelectasis of fetus  on prenatal ultrasound Resolved on 02/20/17 U/S Follow U/S as clinically indicated  Preterm labor symptoms and general obstetric precautions including but not limited to vaginal bleeding, contractions, leaking of fluid and fetal movement were reviewed in detail with the patient. Please refer to After Visit Summary for other counseling recommendations.  Return in about 2 days (around 02/22/2017) for OB visit.   Hermina Staggers, MD

## 2017-02-20 NOTE — Addendum Note (Signed)
Addended by: Hermina Staggers on: 02/20/2017 01:37 PM   Modules accepted: Orders

## 2017-03-01 ENCOUNTER — Telehealth: Payer: Self-pay | Admitting: *Deleted

## 2017-03-01 ENCOUNTER — Inpatient Hospital Stay (HOSPITAL_COMMUNITY)
Admission: AD | Admit: 2017-03-01 | Discharge: 2017-03-01 | Disposition: A | Discharge status: Home / Self Care | Payer: Medicaid Other | Source: Ambulatory Visit | Attending: Family Medicine | Admitting: Family Medicine

## 2017-03-01 ENCOUNTER — Encounter (HOSPITAL_COMMUNITY): Payer: Self-pay | Admitting: Obstetrics and Gynecology

## 2017-03-01 DIAGNOSIS — N949 Unspecified condition associated with female genital organs and menstrual cycle: Secondary | ICD-10-CM

## 2017-03-01 DIAGNOSIS — Z3A32 32 weeks gestation of pregnancy: Secondary | ICD-10-CM | POA: Insufficient documentation

## 2017-03-01 DIAGNOSIS — Z833 Family history of diabetes mellitus: Secondary | ICD-10-CM | POA: Diagnosis not present

## 2017-03-01 DIAGNOSIS — R102 Pelvic and perineal pain: Secondary | ICD-10-CM | POA: Diagnosis not present

## 2017-03-01 DIAGNOSIS — O24419 Gestational diabetes mellitus in pregnancy, unspecified control: Secondary | ICD-10-CM | POA: Diagnosis not present

## 2017-03-01 DIAGNOSIS — O9989 Other specified diseases and conditions complicating pregnancy, childbirth and the puerperium: Secondary | ICD-10-CM

## 2017-03-01 DIAGNOSIS — O26893 Other specified pregnancy related conditions, third trimester: Secondary | ICD-10-CM | POA: Diagnosis not present

## 2017-03-01 DIAGNOSIS — O09523 Supervision of elderly multigravida, third trimester: Secondary | ICD-10-CM | POA: Diagnosis not present

## 2017-03-01 DIAGNOSIS — R1030 Lower abdominal pain, unspecified: Secondary | ICD-10-CM | POA: Diagnosis present

## 2017-03-01 HISTORY — DX: Gestational diabetes mellitus in pregnancy, unspecified control: O24.419

## 2017-03-01 LAB — URINALYSIS, ROUTINE W REFLEX MICROSCOPIC
BILIRUBIN URINE: NEGATIVE
Glucose, UA: NEGATIVE mg/dL
HGB URINE DIPSTICK: NEGATIVE
KETONES UR: NEGATIVE mg/dL
Leukocytes, UA: NEGATIVE
NITRITE: NEGATIVE
Protein, ur: NEGATIVE mg/dL
Specific Gravity, Urine: 1.018 (ref 1.005–1.030)
pH: 6 (ref 5.0–8.0)

## 2017-03-01 NOTE — MAU Provider Note (Signed)
History     CSN: [id number]  Arrival date and time: 03/01/17 1620   Initial provider contact at 1542    Chief Complaint  Patient presents with  . Labor Eval   Veronica Carroll is a 36 y.o. [id number] at 11w5dpresenting with 1 day history of pelvic pressure and intermittent lower abdominal pain and left > right groin pain. The pain is sharp and exacerbated by walking. It radiates to the rectum. It waxes and wanes, but was more intense last night. No similar previous episodes. Denies dysuria or irritative vaginal discharge. No leakage of fluid or vaginal bleeding. Good fetal movement. Pregnancy significant for AMA and A1 GDM. States CBGs mostly within target range. Ultrasound 1 week ago showed normal growth (57th%ile) and normal AFV.     OB History  Gravida Para Term Preterm AB Living  '4 1 1   2 1  '$ SAB TAB Ectopic Multiple Live Births  '1 1     1    '$ # Outcome Date GA Lbr Len/2nd Weight Sex Delivery Anes PTL Lv  4 Current           3 SAB           2 TAB           1 Term                Past Medical History:  Diagnosis Date  . Medical history non-contributory     Past Surgical History:  Procedure Laterality Date  . FOOT SURGERY    . THERAPEUTIC ABORTION    . WISDOM TOOTH EXTRACTION      Family History  Problem Relation Age of Onset  . Diabetes Mother   . Diabetes Maternal Grandmother   . Diabetes Maternal Grandfather     Social History  Substance Use Topics  . Smoking status: Never Smoker  . Smokeless tobacco: Never Used  . Alcohol use No    Allergies: No Known Allergies  Prescriptions Prior to Admission  Medication Sig Dispense Refill Last Dose  . ACCU-CHEK FASTCLIX LANCETS MISC 1 Device by Percutaneous route 4 (four) times daily. 100 each 12 Taking  . Blood Glucose Monitoring Suppl (ACCU-CHEK GUIDE) w/Device KIT 1 kit by Does not apply route 4 (four) times daily. 1 kit 0 Taking  . cetirizine (ZYRTEC ALLERGY) 10 MG tablet Take 10 mg by mouth daily.   Taking   . glucose blood test strip 1 Device by Percutaneous route 4 (four) times daily. 100 each 12 Taking  . Prenatal Vit-Fe Fumarate-FA (PREPLUS) 27-1 MG TABS Take 1 tablet by mouth daily. 30 tablet 13 Taking  . promethazine (PHENERGAN) 25 MG tablet Take 1 tablet (25 mg total) by mouth every 6 (six) hours as needed for nausea or vomiting. 30 tablet 1     Review of Systems  Constitutional: Negative for fever.  Gastrointestinal: Positive for abdominal pain and rectal pain. Negative for constipation, diarrhea and nausea.  Genitourinary: Positive for vaginal pain. Negative for dysuria, flank pain, hematuria, urgency, vaginal bleeding and vaginal discharge.  Neurological: Negative for weakness and light-headedness.  Psychiatric/Behavioral: The patient is not nervous/anxious.    Physical Exam   Last menstrual period 07/15/2016.  Physical Exam  Nursing note and vitals reviewed. Constitutional: She is oriented to person, place, and time. She appears well-developed and well-nourished. No distress.  HENT:  Head: Normocephalic.  Eyes: No scleral icterus.  Neck: Normal range of motion.  Cardiovascular: Normal rate.   Respiratory: Effort normal.  GI:  Soft. There is no tenderness.  Minimal tenderness on groin palpation bilaterally  Genitourinary: No vaginal discharge found.  Genitourinary Comments: SVE: L/C/H, cephalic  Musculoskeletal: Normal range of motion.  Neurological: She is alert and oriented to person, place, and time.  Skin: Skin is warm and dry.  Psychiatric: She has a normal mood and affect. Her behavior is normal.    MAU Course  Procedures Results for orders placed or performed during the hospital encounter of 03/01/17 (from the past 24 hour(s))  Urinalysis, Routine w reflex microscopic     Status: None   Collection Time: 03/01/17  4:50 PM  Result Value Ref Range   Color, Urine YELLOW YELLOW   APPearance CLEAR CLEAR   Specific Gravity, Urine 1.018 1.005 - 1.030   pH 6.0 5.0 -  8.0   Glucose, UA NEGATIVE NEGATIVE mg/dL   Hgb urine dipstick NEGATIVE NEGATIVE   Bilirubin Urine NEGATIVE NEGATIVE   Ketones, ur NEGATIVE NEGATIVE mg/dL   Protein, ur NEGATIVE NEGATIVE mg/dL   Nitrite NEGATIVE NEGATIVE   Leukocytes, UA NEGATIVE NEGATIVE   Fetal monitoring: Fetal heart rate baseline 140-145, moderate variability, accelerations present, no decelerations Toco: No contractions   Assessment and Plan   1. Round ligament pain   2. Gestational diabetes mellitus (GDM) affecting pregnancy, antepartum    Allergies as of 03/01/2017   No Known Allergies     Medication List    STOP taking these medications   promethazine 25 MG tablet Commonly known as:  PHENERGAN     TAKE these medications   ACCU-CHEK FASTCLIX LANCETS Misc 1 Device by Percutaneous route 4 (four) times daily.   ACCU-CHEK GUIDE w/Device Kit 1 kit by Does not apply route 4 (four) times daily.   glucose blood test strip 1 Device by Percutaneous route 4 (four) times daily.   PREPLUS 27-1 MG Tabs Take 1 tablet by mouth daily.   ZYRTEC ALLERGY 10 MG tablet Generic drug:  cetirizine Take 10 mg by mouth daily.      Follow-up [place] for [facility] Follow up on 03/06/2017.   Specialty:  Obstetrics and Gynecology Contact information: [place] [phone]          [place] 03/01/2017, 4:40 PM

## 2017-03-01 NOTE — Discharge Instructions (Signed)
Round Ligament Pain The round ligament is a cord of muscle and tissue that helps to support the uterus. It can become a source of pain during pregnancy if it becomes stretched or twisted as the baby grows. The pain usually begins in the second trimester of pregnancy, and it can come and go until the baby is delivered. It is not a serious problem, and it does not cause harm to the baby. Round ligament pain is usually a short, sharp, and pinching pain, but it can also be a dull, lingering, and aching pain. The pain is felt in the lower side of the abdomen or in the groin. It usually starts deep in the groin and moves up to the outside of the hip area. Pain can occur with:  A sudden change in position.  Rolling over in bed.  Coughing or sneezing.  Physical activity. Follow these instructions at home: Watch your condition for any changes. Take these steps to help with your pain:  When the pain starts, relax. Then try:  Sitting down.  Flexing your knees up to your abdomen.  Lying on your side with one pillow under your abdomen and another pillow between your legs.  Sitting in a warm bath for 15-20 minutes or until the pain goes away.  Take over-the-counter and prescription medicines only as told by your health care provider.  Move slowly when you sit and stand.  Avoid long walks if they cause pain.  Stop or lessen your physical activities if they cause pain. Contact a health care provider if:  Your pain does not go away with treatment.  You feel pain in your back that you did not have before.  Your medicine is not helping. Get help right away if:  You develop a fever or chills.  You develop uterine contractions.  You develop vaginal bleeding.  You develop nausea or vomiting.  You develop diarrhea.  You have pain when you urinate. This information is not intended to replace advice given to you by your health care provider. Make sure you discuss any questions you have with  your health care provider. Document Released: 07/17/2008 Document Revised: 03/15/2016 Document Reviewed: 12/15/2014 Elsevier Interactive Patient Education  2017 Elsevier Inc.  

## 2017-03-01 NOTE — MAU Note (Signed)
c/o ? intermittent ucs since last night around 1930 last night; c/o vaginal pressure and rectal pressure since last night also; denies any vaginal bleeding or discharge;

## 2017-03-01 NOTE — Telephone Encounter (Signed)
Patient called to state she felt like she was having a lot of pressure in her lower abdomen and rectal area. She even felt like she was having Braxton Hick Contractions last night. Told patient that she is too early for CSX CorporationBraxton Hicks contractions and if she feels tightening then that is something that is not normal and needs to be checked. Patient voiced understanding and she will go to MAU for check.

## 2017-03-06 ENCOUNTER — Ambulatory Visit (INDEPENDENT_AMBULATORY_CARE_PROVIDER_SITE_OTHER): Payer: Medicaid Other | Admitting: Obstetrics and Gynecology

## 2017-03-06 DIAGNOSIS — O0993 Supervision of high risk pregnancy, unspecified, third trimester: Secondary | ICD-10-CM | POA: Diagnosis not present

## 2017-03-06 DIAGNOSIS — O358XX1 Maternal care for other (suspected) fetal abnormality and damage, fetus 1: Secondary | ICD-10-CM

## 2017-03-06 DIAGNOSIS — Z3490 Encounter for supervision of normal pregnancy, unspecified, unspecified trimester: Secondary | ICD-10-CM

## 2017-03-06 DIAGNOSIS — O09523 Supervision of elderly multigravida, third trimester: Secondary | ICD-10-CM

## 2017-03-06 DIAGNOSIS — O24913 Unspecified diabetes mellitus in pregnancy, third trimester: Secondary | ICD-10-CM | POA: Diagnosis not present

## 2017-03-06 NOTE — Progress Notes (Signed)
Subjective:  Veronica Carroll is a 36 y.o. G4P1021 at 4961w3d being seen today for ongoing prenatal care.  She is currently monitored for the following issues for this high-risk pregnancy and has Supervision of normal pregnancy, antepartum; AMA (advanced maternal age) multigravida 35+; Pyelectasis of fetus on prenatal ultrasound; Chronic back pain; and Gestational diabetes mellitus (GDM) affecting pregnancy, antepartum on her problem list.  Patient reports no complaints.  Contractions: Not present. Vag. Bleeding: None.  Movement: Present. Denies leaking of fluid.   The following portions of the patient's history were reviewed and updated as appropriate: allergies, current medications, past family history, past medical history, past social history, past surgical history and problem list. Problem list updated.  Objective:   Vitals:   03/06/17 1435  BP: 120/71  Pulse: 99  Weight: 198 lb (89.8 kg)    Fetal Status: Fetal Heart Rate (bpm): 136   Movement: Present     General:  Alert, oriented and cooperative. Patient is in no acute distress.  Skin: Skin is warm and dry. No rash noted.   Cardiovascular: Normal heart rate noted  Respiratory: Normal respiratory effort, no problems with respiration noted  Abdomen: Soft, gravid, appropriate for gestational age. Pain/Pressure: Absent     Pelvic:  Cervical exam deferred        Extremities: Normal range of motion.  Edema: Trace  Mental Status: Normal mood and affect. Normal behavior. Normal judgment and thought content.   Urinalysis:      Assessment and Plan:  Pregnancy: G4P1021 at 6261w3d  1. Encounter for supervision of normal pregnancy, antepartum, unspecified gravidity Stable  2. GDM BS well controlled for the most part. Food choices reviewed  3. AMA Nl QUAD screen , declined NIPS  4. UTD A1 Resolved  Preterm labor symptoms and general obstetric precautions including but not limited to vaginal bleeding, contractions, leaking of fluid and  fetal movement were reviewed in detail with the patient. Please refer to After Visit Summary for other counseling recommendations.  Return in about 2 weeks (around 03/20/2017).   Hermina StaggersErvin, Natalyia Innes L, MD

## 2017-03-20 ENCOUNTER — Telehealth: Payer: Self-pay

## 2017-03-20 NOTE — Telephone Encounter (Signed)
Pt called requesting advice on boil treatment. Pt states that she is 2 boils on her vagina. Sx started 4 days ago. She has tried warm compresses. The first one did drain after applying warm compress, but not completely. The second boil started yesterday. She has also applied neosporin with no relief.

## 2017-03-21 ENCOUNTER — Telehealth: Payer: Self-pay

## 2017-03-21 NOTE — Telephone Encounter (Signed)
Pt has called back awaiting a response to what she should do in reference to the boils on her vagina.

## 2017-03-22 ENCOUNTER — Telehealth: Payer: Self-pay

## 2017-03-22 DIAGNOSIS — N7689 Other specified inflammation of vagina and vulva: Secondary | ICD-10-CM

## 2017-03-22 MED ORDER — CEPHALEXIN 500 MG PO CAPS: 500.0000 mg | ORAL_CAPSULE | Freq: Three times a day (TID) | ORAL | 0 refills | Status: AC

## 2017-03-22 NOTE — Telephone Encounter (Signed)
Contacted pt and advised of rx ordered by the provider.

## 2017-03-27 ENCOUNTER — Other Ambulatory Visit (HOSPITAL_COMMUNITY)
Admission: RE | Admit: 2017-03-27 | Discharge: 2017-03-27 | Disposition: A | Discharge status: Home / Self Care | Payer: Medicaid Other | Source: Ambulatory Visit | Attending: Obstetrics and Gynecology | Admitting: Obstetrics and Gynecology

## 2017-03-27 ENCOUNTER — Ambulatory Visit (INDEPENDENT_AMBULATORY_CARE_PROVIDER_SITE_OTHER): Payer: Medicaid Other | Admitting: Obstetrics and Gynecology

## 2017-03-27 VITALS — BP 122/79 | HR 97 | Wt 194.6 lb

## 2017-03-27 DIAGNOSIS — O24419 Gestational diabetes mellitus in pregnancy, unspecified control: Secondary | ICD-10-CM

## 2017-03-27 DIAGNOSIS — O09523 Supervision of elderly multigravida, third trimester: Secondary | ICD-10-CM

## 2017-03-27 DIAGNOSIS — R197 Diarrhea, unspecified: Secondary | ICD-10-CM

## 2017-03-27 DIAGNOSIS — Z3A36 36 weeks gestation of pregnancy: Secondary | ICD-10-CM | POA: Insufficient documentation

## 2017-03-27 DIAGNOSIS — O26893 Other specified pregnancy related conditions, third trimester: Secondary | ICD-10-CM | POA: Insufficient documentation

## 2017-03-27 DIAGNOSIS — Z3490 Encounter for supervision of normal pregnancy, unspecified, unspecified trimester: Secondary | ICD-10-CM

## 2017-03-27 DIAGNOSIS — O24913 Unspecified diabetes mellitus in pregnancy, third trimester: Secondary | ICD-10-CM | POA: Diagnosis not present

## 2017-03-27 LAB — POCT URINALYSIS DIPSTICK
BILIRUBIN UA: NEGATIVE
Blood, UA: NEGATIVE
GLUCOSE UA: NEGATIVE
LEUKOCYTES UA: NEGATIVE
NITRITE UA: NEGATIVE
Protein, UA: NEGATIVE
Spec Grav, UA: 1.015 (ref 1.010–1.025)
Urobilinogen, UA: 0.2 E.U./dL
pH, UA: 6 (ref 5.0–8.0)

## 2017-03-27 MED ORDER — GLYBURIDE 2.5 MG PO TABS: 2.5000 mg | ORAL_TABLET | Freq: Every day | ORAL | 3 refills | Status: DC

## 2017-03-27 NOTE — Progress Notes (Signed)
Subjective:  Veronica Carroll is a 36 y.o. G4P1021 at 58w3dbeing seen today for ongoing prenatal care.  She is currently monitored for the following issues for this high-risk pregnancy and has Supervision of normal pregnancy, antepartum; AMA (advanced maternal age) multigravida 338+ Pyelectasis of fetus on prenatal ultrasound; Chronic back pain; and Gestational diabetes mellitus (GDM) affecting pregnancy, antepartum on her problem list.  Patient reports loose stools since last visit. Not watery, just softer. No fever. No exposure. Sx started prior to starting Keflex. Sebaccous cyst have improved since starting Keflex..  Contractions: Not present. Vag. Bleeding: None.  Movement: Present. Denies leaking of fluid.   The following portions of the patient's history were reviewed and updated as appropriate: allergies, current medications, past family history, past medical history, past social history, past surgical history and problem list. Problem list updated.  Objective:   Vitals:   03/27/17 0905  BP: 122/79  Pulse: 97  Weight: 194 lb 9.6 oz (88.3 kg)    Fetal Status: Fetal Heart Rate (bpm): 155   Movement: Present     General:  Alert, oriented and cooperative. Patient is in no acute distress.  Skin: Skin is warm and dry. No rash noted.   Cardiovascular: Normal heart rate noted  Respiratory: Normal respiratory effort, no problems with respiration noted  Abdomen: Soft, gravid, appropriate for gestational age. Pain/Pressure: Present     Pelvic:  Cervical exam performed        Extremities: Normal range of motion.  Edema: Mild pitting, slight indentation  Mental Status: Normal mood and affect. Normal behavior. Normal judgment and thought content.   Urinalysis:      Assessment and Plan:  Pregnancy: G4P1021 at 334w3d1. Gestational diabetes mellitus (GDM) affecting pregnancy, antepartum Fasting BS 100's.  - glyBURIDE (DIABETA) 2.5 MG tablet; Take 1 tablet (2.5 mg total) by mouth at bedtime.   Dispense: 60 tablet; Refill: 3  2. Elderly multigravida in third trimester Nl NIPS - Cervicovaginal ancillary only - Strep Gp B NAA  3. Diarrhea, unspecified type Diet reviewed  Imodium PRN Increase fluids - POCT urinalysis dipstick - CBC - Comp Met (CMET)  4. Encounter for supervision of normal pregnancy, antepartum, unspecified gravidity   Preterm labor symptoms and general obstetric precautions including but not limited to vaginal bleeding, contractions, leaking of fluid and fetal movement were reviewed in detail with the patient. Please refer to After Visit Summary for other counseling recommendations.  Return in about 1 week (around 04/03/2017) for OB visit.   ErChancy MilroyMD

## 2017-03-27 NOTE — Addendum Note (Signed)
Addended by: Dalphine HandingGARDNER, Arlan Birks L on: 03/27/2017 09:39 AM   Modules accepted: Orders

## 2017-03-27 NOTE — Progress Notes (Signed)
ROB pt c/o loose stools x several weeks. CBG log available.

## 2017-03-28 ENCOUNTER — Encounter: Payer: Self-pay | Admitting: Obstetrics and Gynecology

## 2017-03-28 LAB — COMPREHENSIVE METABOLIC PANEL
ALBUMIN: 3.4 g/dL — AB (ref 3.5–5.5)
ALT: 7 IU/L (ref 0–32)
AST: 11 IU/L (ref 0–40)
Albumin/Globulin Ratio: 1.2 (ref 1.2–2.2)
Alkaline Phosphatase: 122 IU/L — ABNORMAL HIGH (ref 39–117)
BUN/Creatinine Ratio: 5 — ABNORMAL LOW (ref 9–23)
BUN: 4 mg/dL — AB (ref 6–20)
Bilirubin Total: 0.2 mg/dL (ref 0.0–1.2)
CALCIUM: 8.9 mg/dL (ref 8.7–10.2)
CO2: 17 mmol/L — ABNORMAL LOW (ref 18–29)
Chloride: 103 mmol/L (ref 96–106)
Creatinine, Ser: 0.77 mg/dL (ref 0.57–1.00)
GFR, EST AFRICAN AMERICAN: 114 mL/min/{1.73_m2} (ref >59)
GFR, EST NON AFRICAN AMERICAN: 99 mL/min/{1.73_m2} (ref >59)
GLUCOSE: 146 mg/dL — AB (ref 65–99)
Globulin, Total: 2.9 g/dL (ref 1.5–4.5)
Potassium: 3.9 mmol/L (ref 3.5–5.2)
Sodium: 138 mmol/L (ref 134–144)
TOTAL PROTEIN: 6.3 g/dL (ref 6.0–8.5)

## 2017-03-28 LAB — CBC
Hematocrit: 30.8 % — ABNORMAL LOW (ref 34.0–46.6)
Hemoglobin: 10.1 g/dL — ABNORMAL LOW (ref 11.1–15.9)
MCH: 24.7 pg — ABNORMAL LOW (ref 26.6–33.0)
MCHC: 32.8 g/dL (ref 31.5–35.7)
MCV: 75 fL — AB (ref 79–97)
PLATELETS: 262 10*3/uL (ref 150–379)
RBC: 4.09 x10E6/uL (ref 3.77–5.28)
RDW: 15.4 % (ref 12.3–15.4)
WBC: 4.5 10*3/uL (ref 3.4–10.8)

## 2017-03-28 LAB — CERVICOVAGINAL ANCILLARY ONLY
CHLAMYDIA, DNA PROBE: NEGATIVE
Neisseria Gonorrhea: NEGATIVE

## 2017-03-29 LAB — STREP GP B NAA: STREP GROUP B AG: NEGATIVE

## 2017-04-03 ENCOUNTER — Inpatient Hospital Stay (EMERGENCY_DEPARTMENT_HOSPITAL)
Admission: AD | Admit: 2017-04-03 | Discharge: 2017-04-03 | Disposition: A | Discharge status: Home / Self Care | Payer: Medicaid Other | Source: Ambulatory Visit | Attending: Obstetrics & Gynecology | Admitting: Obstetrics & Gynecology

## 2017-04-03 ENCOUNTER — Encounter: Payer: Self-pay | Admitting: Obstetrics & Gynecology

## 2017-04-03 ENCOUNTER — Encounter (HOSPITAL_COMMUNITY): Payer: Self-pay

## 2017-04-03 ENCOUNTER — Ambulatory Visit (INDEPENDENT_AMBULATORY_CARE_PROVIDER_SITE_OTHER): Payer: Medicaid Other | Admitting: Obstetrics & Gynecology

## 2017-04-03 VITALS — BP 116/74 | HR 101 | Wt 195.0 lb

## 2017-04-03 DIAGNOSIS — O24913 Unspecified diabetes mellitus in pregnancy, third trimester: Secondary | ICD-10-CM | POA: Diagnosis not present

## 2017-04-03 DIAGNOSIS — O479 False labor, unspecified: Secondary | ICD-10-CM

## 2017-04-03 DIAGNOSIS — O0993 Supervision of high risk pregnancy, unspecified, third trimester: Secondary | ICD-10-CM

## 2017-04-03 DIAGNOSIS — O24419 Gestational diabetes mellitus in pregnancy, unspecified control: Secondary | ICD-10-CM

## 2017-04-03 DIAGNOSIS — Z3490 Encounter for supervision of normal pregnancy, unspecified, unspecified trimester: Secondary | ICD-10-CM

## 2017-04-03 MED ORDER — GLYBURIDE 2.5 MG PO TABS: 2.5000 mg | ORAL_TABLET | Freq: Two times a day (BID) | ORAL | 3 refills | Status: DC

## 2017-04-03 NOTE — Progress Notes (Signed)
Patient see for labor eval. Cervix unchanged.   FHTS; 150/mod var/reactive  A/P D/C home with labor precautions.  Ernestina PennaNicholas Magon Croson, MD 04/04/17 12:40 AM

## 2017-04-03 NOTE — Discharge Instructions (Signed)

## 2017-04-03 NOTE — Progress Notes (Signed)
G4P1 @ 37.[redacted] wksga. Presents to triage for ctx that progressively got worse since 1715. Denies LOF. Has bloody show. + FM. EFM applie. VSS see flow sheet for details.   1955: Sve: 3.5/50/-3   2004: Provider notified. Will call back.  2009: Provider called to unit. Report status of pt given. Orders received to recheck cerivix in an hr.   2100: SVE unchanged.  [id number]: Provider notified. Report status of pt given. Orders received to discharge pt home on labor precaution.   Discharge instructions given with pt understanding. Pt left unit via ambulatory with family.

## 2017-04-03 NOTE — Progress Notes (Signed)
   PRENATAL VISIT NOTE  Subjective:  Veronica Carroll is a 36 y.o. [id number] at 2029w3d being seen today for ongoing prenatal care.  She is currently monitored for the following issues for this high-risk pregnancy and has Supervision of normal pregnancy, antepartum; AMA (advanced maternal age) multigravida 35+; Pyelectasis of fetus on prenatal ultrasound; Chronic back pain; and Gestational diabetes mellitus (GDM) affecting pregnancy, antepartum on her problem list.  Patient reports occasional contractions and vaginal spotting.  Contractions: Irregular. Vag. Bleeding: None.  Movement: Present. Denies leaking of fluid.   The following portions of the patient's history were reviewed and updated as appropriate: allergies, current medications, past family history, past medical history, past social history, past surgical history and problem list. Problem list updated.  Objective:   Vitals:   04/03/17 1549  BP: 116/74  Pulse: (!) 101  Weight: 195 lb (88.5 kg)    Fetal Status: Fetal Heart Rate (bpm): NST   Movement: Present     General:  Alert, oriented and cooperative. Patient is in no acute distress.  Skin: Skin is warm and dry. No rash noted.   Cardiovascular: Normal heart rate noted  Respiratory: Normal respiratory effort, no problems with respiration noted  Abdomen: Soft, gravid, appropriate for gestational age. Pain/Pressure: Present     Pelvic:  Cervical exam performed Dilation: 2.5 Effacement (%): 60    Extremities: Normal range of motion.  Edema: Mild pitting, slight indentation  Mental Status: Normal mood and affect. Normal behavior. Normal judgment and thought content.   Assessment and Plan:  Pregnancy: G4P1021 at 4829w3d  1. Encounter for supervision of normal pregnancy, antepartum, unspecified gravidity Cervical change and bloody show  2. Gestational diabetes mellitus (GDM) affecting pregnancy, antepartum Few elevated FBS and PP up to 170, increase dose - glyBURIDE (DIABETA) 2.5 MG  tablet; Take 1 tablet (2.5 mg total) by mouth 2 (two) times daily with a meal.  Dispense: 60 tablet; Refill: 3 - US MFM OB FOLLOW UP; Future  Term labor symptoms and general obstetric precautions including but not limited to vaginal bleeding, contractions, leaking of fluid and fetal movement were reviewed in detail with the patient. Please refer to After Visit Summary for other counseling recommendations.  Return in about 1 week (around 04/10/2017).   Scheryl DarterJames Arnold, MD

## 2017-04-03 NOTE — Progress Notes (Signed)
Pt states that she has been having dark brown/reddish discharge x today. Pt would like to be checked today. Pt is having increase in LE swelling. Pt states glucose is doing better since starting on Glyburide.

## 2017-04-03 NOTE — Patient Instructions (Signed)
Vaginal Delivery Vaginal delivery means that you will give birth by pushing your baby out of your birth canal (vagina). A team of health care providers will help you before, during, and after vaginal delivery. Birth experiences are unique for every woman and every pregnancy, and birth experiences vary depending on where you choose to give birth. What should I do to prepare for my baby's birth? Before your baby is born, it is important to talk with your health care provider about:  Your labor and delivery preferences. These may include: ? Medicines that you may be given. ? How you will manage your pain. This might include non-medical pain relief techniques or injectable pain relief such as epidural analgesia. ? How you and your baby will be monitored during labor and delivery. ? Who may be in the labor and delivery room with you. ? Your feelings about surgical delivery of your baby (cesarean delivery, or C-section) if this becomes necessary. ? Your feelings about receiving donated blood through an IV tube (blood transfusion) if this becomes necessary.  Whether you are able: ? To take pictures or videos of the birth. ? To eat during labor and delivery. ? To move around, walk, or change positions during labor and delivery.  What to expect after your baby is born, such as: ? Whether delayed umbilical cord clamping and cutting is offered. ? Who will care for your baby right after birth. ? Medicines or tests that may be recommended for your baby. ? Whether breastfeeding is supported in your hospital or birth center. ? How long you will be in the hospital or birth center.  How any medical conditions you have may affect your baby or your labor and delivery experience.  To prepare for your baby's birth, you should also:  Attend all of your health care visits before delivery (prenatal visits) as recommended by your health care provider. This is important.  Prepare your home for your baby's  arrival. Make sure that you have: ? Diapers. ? Baby clothing. ? Feeding equipment. ? Safe sleeping arrangements for you and your baby.  Install a car seat in your vehicle. Have your car seat checked by a certified car seat installer to make sure that it is installed safely.  Think about who will help you with your new baby at home for at least the first several weeks after delivery.  What can I expect when I arrive at the birth center or hospital? Once you are in labor and have been admitted into the hospital or birth center, your health care provider may:  Review your pregnancy history and any concerns you have.  Insert an IV tube into one of your veins. This is used to give you fluids and medicines.  Check your blood pressure, pulse, temperature, and heart rate (vital signs).  Check whether your bag of water (amniotic sac) has broken (ruptured).  Talk with you about your birth plan and discuss pain control options.  Monitoring Your health care provider may monitor your contractions (uterine monitoring) and your baby's heart rate (fetal monitoring). You may need to be monitored:  Often, but not continuously (intermittently).  All the time or for long periods at a time (continuously). Continuous monitoring may be needed if: ? You are taking certain medicines, such as medicine to relieve pain or make your contractions stronger. ? You have pregnancy or labor complications.  Monitoring may be done by:  Placing a special stethoscope or a handheld monitoring device on your abdomen to   check your baby's heartbeat, and feeling your abdomen for contractions. This method of monitoring does not continuously record your baby's heartbeat or your contractions.  Placing monitors on your abdomen (external monitors) to record your baby's heartbeat and the frequency and length of contractions. You may not have to wear external monitors all the time.  Placing monitors inside of your uterus  (internal monitors) to record your baby's heartbeat and the frequency, length, and strength of your contractions. ? Your health care provider may use internal monitors if he or she needs more information about the strength of your contractions or your baby's heart rate. ? Internal monitors are put in place by passing a thin, flexible wire through your vagina and into your uterus. Depending on the type of monitor, it may remain in your uterus or on your baby's head until birth. ? Your health care provider will discuss the benefits and risks of internal monitoring with you and will ask for your permission before inserting the monitors.  Telemetry. This is a type of continuous monitoring that can be done with external or internal monitors. Instead of having to stay in bed, you are able to move around during telemetry. Ask your health care provider if telemetry is an option for you.  Physical exam Your health care provider may perform a physical exam. This may include:  Checking whether your baby is positioned: ? With the head toward your vagina (head-down). This is most common. ? With the head toward the top of your uterus (head-up or breech). If your baby is in a breech position, your health care provider may try to turn your baby to a head-down position so you can deliver vaginally. If it does not seem that your baby can be born vaginally, your provider may recommend surgery to deliver your baby. In rare cases, you may be able to deliver vaginally if your baby is head-up (breech delivery). ? Lying sideways (transverse). Babies that are lying sideways cannot be delivered vaginally.  Checking your cervix to determine: ? Whether it is thinning out (effacing). ? Whether it is opening up (dilating). ? How low your baby has moved into your birth canal.  What are the three stages of labor and delivery?  Normal labor and delivery is divided into the following three stages: Stage 1  Stage 1 is the  longest stage of labor, and it can last for hours or days. Stage 1 includes: ? Early labor. This is when contractions may be irregular, or regular and mild. Generally, early labor contractions are more than 10 minutes apart. ? Active labor. This is when contractions get longer, more regular, more frequent, and more intense. ? The transition phase. This is when contractions happen very close together, are very intense, and may last longer than during any other part of labor.  Contractions generally feel mild, infrequent, and irregular at first. They get stronger, more frequent (about every 2-3 minutes), and more regular as you progress from early labor through active labor and transition.  Many women progress through stage 1 naturally, but you may need help to continue making progress. If this happens, your health care provider may talk with you about: ? Rupturing your amniotic sac if it has not ruptured yet. ? Giving you medicine to help make your contractions stronger and more frequent.  Stage 1 ends when your cervix is completely dilated to 4 inches (10 cm) and completely effaced. This happens at the end of the transition phase. Stage 2  Once   your cervix is completely effaced and dilated to 4 inches (10 cm), you may start to feel an urge to push. It is common for the body to naturally take a rest before feeling the urge to push, especially if you received an epidural or certain other pain medicines. This rest period may last for up to 1-2 hours, depending on your unique labor experience.  During stage 2, contractions are generally less painful, because pushing helps relieve contraction pain. Instead of contraction pain, you may feel stretching and burning pain, especially when the widest part of your baby's head passes through the vaginal opening (crowning).  Your health care provider will closely monitor your pushing progress and your baby's progress through the vagina during stage 2.  Your  health care provider may massage the area of skin between your vaginal opening and anus (perineum) or apply warm compresses to your perineum. This helps it stretch as the baby's head starts to crown, which can help prevent perineal tearing. ? In some cases, an incision may be made in your perineum (episiotomy) to allow the baby to pass through the vaginal opening. An episiotomy helps to make the opening of the vagina larger to allow more room for the baby to fit through.  It is very important to breathe and focus so your health care provider can control the delivery of your baby's head. Your health care provider may have you decrease the intensity of your pushing, to help prevent perineal tearing.  After delivery of your baby's head, the shoulders and the rest of the body generally deliver very quickly and without difficulty.  Once your baby is delivered, the umbilical cord may be cut right away, or this may be delayed for 1-2 minutes, depending on your baby's health. This may vary among health care providers, hospitals, and birth centers.  If you and your baby are healthy enough, your baby may be placed on your chest or abdomen to help maintain the baby's temperature and to help you bond with each other. Some mothers and babies start breastfeeding at this time. Your health care team will dry your baby and help keep your baby warm during this time.  Your baby may need immediate care if he or she: ? Showed signs of distress during labor. ? Has a medical condition. ? Was born too early (prematurely). ? Had a bowel movement before birth (meconium). ? Shows signs of difficulty transitioning from being inside the uterus to being outside of the uterus. If you are planning to breastfeed, your health care team will help you begin a feeding. Stage 3  The third stage of labor starts immediately after the birth of your baby and ends after you deliver the placenta. The placenta is an organ that develops  during pregnancy to provide oxygen and nutrients to your baby in the womb.  Delivering the placenta may require some pushing, and you may have mild contractions. Breastfeeding can stimulate contractions to help you deliver the placenta.  After the placenta is delivered, your uterus should tighten (contract) and become firm. This helps to stop bleeding in your uterus. To help your uterus contract and to control bleeding, your health care provider may: ? Give you medicine by injection, through an IV tube, by mouth, or through your rectum (rectally). ? Massage your abdomen or perform a vaginal exam to remove any blood clots that are left in your uterus. ? Empty your bladder by placing a thin, flexible tube (catheter) into your bladder. ? Encourage   you to breastfeed your baby. After labor is over, you and your baby will be monitored closely to ensure that you are both healthy until you are ready to go home. Your health care team will teach you how to care for yourself and your baby. This information is not intended to replace advice given to you by your health care provider. Make sure you discuss any questions you have with your health care provider. Document Released: 07/17/2008 Document Revised: 04/27/2016 Document Reviewed: 10/23/2015 Elsevier Interactive Patient Education  2018 [employer]  

## 2017-04-03 NOTE — MAU Note (Signed)
Pt reports contractions every 5-6 mins. Was in office today and was 2.5cm. Pt denies LOF. Has some bloody mucous. Reports good fetal movement.

## 2017-04-04 ENCOUNTER — Inpatient Hospital Stay (HOSPITAL_COMMUNITY)
Admission: AD | Admit: 2017-04-04 | Discharge: 2017-04-06 | DRG: 775 | Disposition: A | Discharge status: Home / Self Care | Payer: Medicaid Other | Source: Ambulatory Visit | Attending: Obstetrics & Gynecology | Admitting: Obstetrics & Gynecology

## 2017-04-04 ENCOUNTER — Encounter (HOSPITAL_COMMUNITY): Payer: Self-pay

## 2017-04-04 DIAGNOSIS — O24425 Gestational diabetes mellitus in childbirth, controlled by oral hypoglycemic drugs: Secondary | ICD-10-CM | POA: Diagnosis present

## 2017-04-04 DIAGNOSIS — E669 Obesity, unspecified: Secondary | ICD-10-CM | POA: Diagnosis present

## 2017-04-04 DIAGNOSIS — O479 False labor, unspecified: Secondary | ICD-10-CM | POA: Diagnosis not present

## 2017-04-04 DIAGNOSIS — O24419 Gestational diabetes mellitus in pregnancy, unspecified control: Secondary | ICD-10-CM

## 2017-04-04 DIAGNOSIS — Z6836 Body mass index (BMI) 36.0-36.9, adult: Secondary | ICD-10-CM

## 2017-04-04 DIAGNOSIS — Z3A37 37 weeks gestation of pregnancy: Secondary | ICD-10-CM | POA: Diagnosis not present

## 2017-04-04 DIAGNOSIS — O99214 Obesity complicating childbirth: Secondary | ICD-10-CM | POA: Diagnosis present

## 2017-04-04 DIAGNOSIS — O24429 Gestational diabetes mellitus in childbirth, unspecified control: Secondary | ICD-10-CM | POA: Diagnosis not present

## 2017-04-04 LAB — CBC
HCT: 30.5 % — ABNORMAL LOW (ref 36.0–46.0)
Hemoglobin: 10 g/dL — ABNORMAL LOW (ref 12.0–15.0)
MCH: 24.6 pg — ABNORMAL LOW (ref 26.0–34.0)
MCHC: 32.8 g/dL (ref 30.0–36.0)
MCV: 75.1 fL — AB (ref 78.0–100.0)
PLATELETS: 254 10*3/uL (ref 150–400)
RBC: 4.06 MIL/uL (ref 3.87–5.11)
RDW: 15.1 % (ref 11.5–15.5)
WBC: 8.2 10*3/uL (ref 4.0–10.5)

## 2017-04-04 LAB — TYPE AND SCREEN
ABO/RH(D): A NEG
ANTIBODY SCREEN: NEGATIVE

## 2017-04-04 LAB — RPR: RPR Ser Ql: NONREACTIVE

## 2017-04-04 LAB — GLUCOSE, CAPILLARY: Glucose-Capillary: 120 mg/dL — ABNORMAL HIGH (ref 65–99)

## 2017-04-04 LAB — ABO/RH: ABO/RH(D): A NEG

## 2017-04-04 MED ORDER — FENTANYL CITRATE (PF) 100 MCG/2ML IJ SOLN: 100.0000 ug | INTRAMUSCULAR | Status: DC | PRN

## 2017-04-04 MED ORDER — PRENATAL MULTIVITAMIN CH
1.0000 | ORAL_TABLET | Freq: Every day | ORAL | Status: DC
  Administered 2017-04-04 – 2017-04-06 (×3): 1 via ORAL
  Filled 2017-04-04 (×3): qty 1

## 2017-04-04 MED ORDER — LIDOCAINE HCL (PF) 1 % IJ SOLN
30.0000 mL | INTRAMUSCULAR | Status: AC | PRN
  Administered 2017-04-04: 30 mL via SUBCUTANEOUS
  Filled 2017-04-04: qty 30

## 2017-04-04 MED ORDER — ONDANSETRON HCL 4 MG PO TABS
4.0000 mg | ORAL_TABLET | ORAL | Status: DC | PRN
Start: 2017-04-04 — End: 2017-04-06

## 2017-04-04 MED ORDER — LACTATED RINGERS IV SOLN: 500.0000 mL | INTRAVENOUS | Status: DC | PRN

## 2017-04-04 MED ORDER — TETANUS-DIPHTH-ACELL PERTUSSIS 5-2.5-18.5 LF-MCG/0.5 IM SUSP: 0.5000 mL | Freq: Once | INTRAMUSCULAR | Status: DC

## 2017-04-04 MED ORDER — OXYCODONE-ACETAMINOPHEN 5-325 MG PO TABS: 1.0000 | ORAL_TABLET | ORAL | Status: DC | PRN

## 2017-04-04 MED ORDER — SENNOSIDES-DOCUSATE SODIUM 8.6-50 MG PO TABS
2.0000 | ORAL_TABLET | ORAL | Status: DC
  Administered 2017-04-04 – 2017-04-05 (×2): 2 via ORAL
  Filled 2017-04-04 (×2): qty 2

## 2017-04-04 MED ORDER — LORATADINE 10 MG PO TABS
10.0000 mg | ORAL_TABLET | Freq: Every day | ORAL | Status: DC
  Administered 2017-04-04 – 2017-04-06 (×3): 10 mg via ORAL
  Filled 2017-04-04 (×3): qty 1

## 2017-04-04 MED ORDER — ONDANSETRON HCL 4 MG/2ML IJ SOLN: 4.0000 mg | Freq: Four times a day (QID) | INTRAMUSCULAR | Status: DC | PRN

## 2017-04-04 MED ORDER — COCONUT OIL OIL
1.0000 "application " | TOPICAL_OIL | Status: DC | PRN
  Administered 2017-04-04: 1 via TOPICAL
  Filled 2017-04-04: qty 120

## 2017-04-04 MED ORDER — ACETAMINOPHEN 325 MG PO TABS: 650.0000 mg | ORAL_TABLET | ORAL | Status: DC | PRN

## 2017-04-04 MED ORDER — DIPHENHYDRAMINE HCL 25 MG PO CAPS: 25.0000 mg | ORAL_CAPSULE | Freq: Four times a day (QID) | ORAL | Status: DC | PRN

## 2017-04-04 MED ORDER — WITCH HAZEL-GLYCERIN EX PADS: 1.0000 "application " | MEDICATED_PAD | CUTANEOUS | Status: DC | PRN

## 2017-04-04 MED ORDER — SOD CITRATE-CITRIC ACID 500-334 MG/5ML PO SOLN: 30.0000 mL | ORAL | Status: DC | PRN

## 2017-04-04 MED ORDER — OXYTOCIN 40 UNITS IN LACTATED RINGERS INFUSION - SIMPLE MED
2.5000 [IU]/h | INTRAVENOUS | Status: DC
  Filled 2017-04-04: qty 1000

## 2017-04-04 MED ORDER — LACTATED RINGERS IV SOLN
INTRAVENOUS | Status: DC
  Administered 2017-04-04: 06:00:00 via INTRAVENOUS

## 2017-04-04 MED ORDER — OXYCODONE-ACETAMINOPHEN 5-325 MG PO TABS: 2.0000 | ORAL_TABLET | ORAL | Status: DC | PRN

## 2017-04-04 MED ORDER — BENZOCAINE-MENTHOL 20-0.5 % EX AERO
1.0000 "application " | INHALATION_SPRAY | CUTANEOUS | Status: DC | PRN
  Administered 2017-04-04: 1 via TOPICAL
  Filled 2017-04-04: qty 56

## 2017-04-04 MED ORDER — OXYTOCIN BOLUS FROM INFUSION
500.0000 mL | Freq: Once | INTRAVENOUS | Status: AC
  Administered 2017-04-04: 500 mL via INTRAVENOUS

## 2017-04-04 MED ORDER — SIMETHICONE 80 MG PO CHEW: 80.0000 mg | CHEWABLE_TABLET | ORAL | Status: DC | PRN

## 2017-04-04 MED ORDER — DIBUCAINE 1 % RE OINT: 1.0000 "application " | TOPICAL_OINTMENT | RECTAL | Status: DC | PRN

## 2017-04-04 MED ORDER — IBUPROFEN 600 MG PO TABS
600.0000 mg | ORAL_TABLET | Freq: Four times a day (QID) | ORAL | Status: DC
  Administered 2017-04-04 – 2017-04-06 (×9): 600 mg via ORAL
  Filled 2017-04-04 (×9): qty 1

## 2017-04-04 MED ORDER — ONDANSETRON HCL 4 MG/2ML IJ SOLN: 4.0000 mg | INTRAMUSCULAR | Status: DC | PRN

## 2017-04-04 MED ORDER — ACETAMINOPHEN 325 MG PO TABS
650.0000 mg | ORAL_TABLET | ORAL | Status: DC | PRN
  Administered 2017-04-04 – 2017-04-05 (×2): 650 mg via ORAL
  Filled 2017-04-04 (×2): qty 2

## 2017-04-04 MED ORDER — ZOLPIDEM TARTRATE 5 MG PO TABS: 5.0000 mg | ORAL_TABLET | Freq: Every evening | ORAL | Status: DC | PRN

## 2017-04-04 NOTE — H&P (Signed)
LABOR AND DELIVERY ADMISSION HISTORY AND PHYSICAL NOTE  Flossie Wexler is a 36 y.o. female [phone] with IUP at [redacted]w[redacted]d by LMP presenting for SOL. She has history of GDMA2 on glyburide with fairly controlled sugars.   She reports positive fetal movement. She denies leakage of fluid or vaginal bleeding.  Prenatal History/Complications:  Past Medical History: Past Medical History:  Diagnosis Date  . Gestational diabetes     Past Surgical History: Past Surgical History:  Procedure Laterality Date  . FOOT SURGERY    . THERAPEUTIC ABORTION    . WISDOM TOOTH EXTRACTION      Obstetrical History: OB History    Gravida Para Term Preterm AB Living   4 1 1   2 1    SAB TAB Ectopic Multiple Live Births   1 1     1       Social History: Social History   Social History  . Marital status: Married    Spouse name: N/A  . Number of children: N/A  . Years of education: N/A   Social History Main Topics  . Smoking status: Never Smoker  . Smokeless tobacco: Never Used  . Alcohol use No  . Drug use: No  . Sexual activity: Yes    Birth control/ protection: None   Other Topics Concern  . Not on file   Social History Narrative  . No narrative on file    Family History: Family History  Problem Relation Age of Onset  . Diabetes Mother   . Diabetes Maternal Grandmother   . Diabetes Maternal Grandfather     Allergies: No Known Allergies  Prescriptions Prior to Admission  Medication Sig Dispense Refill Last Dose  . cetirizine (ZYRTEC ALLERGY) 10 MG tablet Take 10 mg by mouth daily.   Past Week at Unknown time  . glyBURIDE (DIABETA) 2.5 MG tablet Take 1 tablet (2.5 mg total) by mouth 2 (two) times daily with a meal. 60 tablet 3 04/02/2017 at Unknown time  . Prenatal Vit-Fe Fumarate-FA (PREPLUS) 27-1 MG TABS Take 1 tablet by mouth daily. 30 tablet 13 04/02/2017 at Unknown time  . promethazine (PHENERGAN) 25 MG tablet Take 25 mg by mouth every 6 (six) hours as needed for nausea or  vomiting.   Past Week at Unknown time  . ranitidine (ZANTAC) 150 MG tablet Take 150 mg by mouth daily.   04/02/2017 at Unknown time     Review of Systems   All systems reviewed and negative except as stated in HPI  Last menstrual period 07/15/2016. General appearance: alert, cooperative and appears stated age Lungs: no respiratory distress Heart: regular rate and rhythm Abdomen: soft, non-tender; bowel sounds normal Extremities: No calf swelling or tenderness Presentation: cephalic by nursing exam Fetal monitoring: category 1 Uterine activity: contraction every 3-5 minutes Dilation: 10 Effacement (%): 100 Station: 0, +1 Exam by:: Camelia Eng RN   Prenatal labs: ABO, Rh: A/Negative/-- (12/27 1507) Antibody: Negative (12/27 1507) Rubella: !Error! RPR: Non Reactive (04/12 0145)  HBsAg: Negative (12/27 1507)  HIV: Non Reactive (04/12 0145)  GBS: Negative (06/06 0944)  1 hr Glucola: abnormal Genetic screening:  Quad normal Anatomy US: normal  Prenatal Transfer Tool  Maternal Diabetes: Yes:  Diabetes Type:  Insulin/Medication controlled Genetic Screening: Normal Maternal Ultrasounds/Referrals: Normal Fetal Ultrasounds or other Referrals:  None Maternal Substance Abuse:  No Significant Maternal Medications:  None Significant Maternal Lab Results: Lab values include: Group B Strep negative  No results found for this or any previous visit (from  the past 24 hour(s)).  Patient Active Problem List   Diagnosis Date Noted  . Normal labor 04/04/2017  . Gestational diabetes mellitus (GDM) affecting pregnancy, antepartum 02/04/2017  . Chronic back pain 01/09/2017  . AMA (advanced maternal age) multigravida 35+ 12/11/2016  . Pyelectasis of fetus on prenatal ultrasound 12/11/2016  . Supervision of normal pregnancy, antepartum 10/17/2016    Assessment: Darcella Cheshireerika Hotard is a 36 y.o. [id number] at 7445w4d here for  SOL with history of GDMA2  #Labor: expectant  managment #Pain: nothing #FWB: Category 1 #ID:  GBS negative #MOF: breast #MOC: iud #Circ:  N/a #GDMA2: sugar 120 on admission  Ernestina Pennaicholas Schenk 04/04/2017, 5:55 AM

## 2017-04-04 NOTE — Progress Notes (Signed)
UR chart review completed.  

## 2017-04-04 NOTE — Lactation Note (Signed)
This note was copied from a baby's chart. Lactation Consultation Note  Baby 4 hours old.  P2,  Ex BF for 6 months. Baby wrapped in blankets in visitors arms upon entering. Reviewed hand expression bilaterally with mom with no drops expressed. Attempted bf in cross cradle but baby is sleepy and would not wake to bf. Placed baby back on mother's chest STS and covered with blankets. Encouraged mother to practice hand expression often. LC to follow up today.  Patient Name: Veronica Carroll [id number]'XToday's Date: 04/04/2017 Reason for consult: Initial assessment   Maternal Data Has patient been taught Hand Expression?: Yes Does the patient have breastfeeding experience prior to this delivery?: Yes  Feeding Feeding Type: Breast Fed Length of feed: 13 min  LATCH Score/Interventions Latch: Grasps breast easily, tongue down, lips flanged, rhythmical sucking. Intervention(s): Skin to skin;Teach feeding cues  Audible Swallowing: A few with stimulation Intervention(s): Skin to skin  Type of Nipple: Everted at rest and after stimulation  Comfort (Breast/Nipple): Soft / non-tender     Hold (Positioning): Full assist, staff holds infant at breast Intervention(s):  (DEBP)  LATCH Score: 5  Lactation Tools Discussed/Used     Consult Status Consult Status: Follow-up Date: 04/05/17 Follow-up type: In-patient    Veronica Carroll, Veronica Carroll 04/04/2017, 10:40 AM

## 2017-04-05 MED ORDER — IBUPROFEN 600 MG PO TABS: 600.0000 mg | ORAL_TABLET | Freq: Four times a day (QID) | ORAL | 0 refills | Status: DC

## 2017-04-05 MED ORDER — RHO D IMMUNE GLOBULIN 1500 UNIT/2ML IJ SOSY
300.0000 ug | PREFILLED_SYRINGE | Freq: Once | INTRAMUSCULAR | Status: AC
  Administered 2017-04-05: 300 ug via INTRAVENOUS
  Filled 2017-04-05: qty 2

## 2017-04-05 NOTE — Discharge Instructions (Signed)

## 2017-04-05 NOTE — Lactation Note (Signed)
This note was copied from a baby's chart. Lactation Consultation Note  Patient Name: Veronica Carroll WUJWJ'XToday's Date: 04/05/2017 Reason for consult: Follow-up assessment  Follow up visit at 35 hours of age.  Mom reports offering baby breast and formula feedings because baby is only staying on breast for about 5 minutes.  Mom reports pumping once today and not seeing any colostrum.  Mom continues to work on hand expression and still not seeing colostrum.  LC encouraged mom to spend a few minutes on one breast alternate to the other and then back to likely see colostrum.   When asked how LC can assist with breastfeeding goals mom reports just making sure she is doing breastfeeding right.  [facility] with next feeding.  Last LATCH "7" by RN.   Mom requests formula for when she goes home.  LC explained hospital does not supply formula to go home.  LC encouraged mom to pump every time baby is supplemented with formula to increase milk production and decrease need for formula.  Mom to ask peds about type of formula to use at home.     Maternal Data    Feeding Feeding Type: Bottle Fed - Formula Nipple Type: Slow - flow  LATCH Score/Interventions                Intervention(s): Breastfeeding basics reviewed     Lactation Tools Discussed/Used WIC Program: Yes   Consult Status Consult Status: Follow-up Follow-up type: In-patient    Jannifer RodneyShoptaw, Veronica Carroll 04/05/2017, 5:23 PM

## 2017-04-05 NOTE — Discharge Summary (Signed)
OB Discharge Summary  Patient Name: Veronica Carroll DOB: [birth year] MRN: [id number]  Date of admission: 04/04/2017 Delivering MD: Lorne Skeens   Date of discharge: 04/05/2017  Admitting diagnosis: LABOR CHECK Intrauterine pregnancy: [redacted]w[redacted]d     Secondary diagnosis:Active Problems:   Normal labor  Additional problems:A2DM, AMA, obesity     Discharge diagnosis: Preterm Pregnancy Delivered                                                                      Complications: None  Hospital course:  Onset of Labor With Vaginal Delivery     36 y.o. yo [id number] at [redacted]w[redacted]d was admitted in Active Labor on 04/04/2017. Patient had an uncomplicated labor course as follows:  Membrane Rupture Time/Date: 5:59 AM ,04/04/2017   Intrapartum Procedures: Episiotomy:                                           Lacerations:  2nd degree [3]  Patient had a delivery of a Viable infant. 04/04/2017  Information for the patient's newborn:  Joelene, [place] Girl Elzora [[id number]]  Delivery Method: Vaginal, Spontaneous Delivery (Filed from Delivery Summary)    Pateint had an uncomplicated postpartum course.  She is ambulating, tolerating a regular diet, passing flatus, and urinating well. Patient is discharged home in stable condition on 04/05/17.   Physical exam  Vitals:   04/04/17 0809 04/04/17 0900 04/04/17 1300 04/05/17 0514  BP: 122/64 115/63 (!) 118/52 (!) 126/53  Pulse: 80 80 78 84  Resp: 16 16 18 17   Temp: 98.2 F (36.8 C) 97.8 F (36.6 C) 98.2 F (36.8 C) 97.9 F (36.6 C)  TempSrc: Oral Oral Oral Oral  SpO2: 100% 100% 99%    General: alert Lochia: appropriate Uterine Fundus: firm Incision: N/A DVT Evaluation: No evidence of DVT seen on physical exam. Labs: Lab Results  Component Value Date   WBC 8.2 04/04/2017   HGB 10.0 (L) 04/04/2017   HCT 30.5 (L) 04/04/2017   MCV 75.1 (L) 04/04/2017   PLT 254 04/04/2017   CMP Latest Ref Rng & Units 03/27/2017  Glucose 65 - 99 mg/dL 403(K)   BUN 6 - 20 mg/dL 4(L)  Creatinine 7.42 - 1.00 mg/dL 5.95  Sodium 638 - 756 mmol/L 138  Potassium 3.5 - 5.2 mmol/L 3.9  Chloride 96 - 106 mmol/L 103  CO2 18 - 29 mmol/L 17(L)  Calcium 8.7 - 10.2 mg/dL 8.9  Total Protein 6.0 - 8.5 g/dL 6.3  Total Bilirubin 0.0 - 1.2 mg/dL 0.2  Alkaline Phos 39 - 117 IU/L 122(H)  AST 0 - 40 IU/L 11  ALT 0 - 32 IU/L 7    Discharge instruction: per After Visit Summary and "Baby and Me Booklet".  After Visit Meds:  Allergies as of 04/05/2017   No Known Allergies     Medication List    STOP taking these medications   glyBURIDE 2.5 MG tablet Commonly known as:  DIABETA     TAKE these medications   ibuprofen 600 MG tablet Commonly known as:  ADVIL,MOTRIN Take 1 tablet (600 mg total) by mouth every 6 (six)  hours.   PREPLUS 27-1 MG Tabs Take 1 tablet by mouth daily.   promethazine 25 MG tablet Commonly known as:  PHENERGAN Take 25 mg by mouth every 6 (six) hours as needed for nausea or vomiting.   ranitidine 150 MG tablet Commonly known as:  ZANTAC Take 150 mg by mouth daily.   ZYRTEC ALLERGY 10 MG tablet Generic drug:  cetirizine Take 10 mg by mouth daily.       Diet: carb modified  Activity: Advance as tolerated. Pelvic rest for 6 weeks.   Outpatient follow up:4 weeks Follow up Appt:No future appointments. Follow up visit: No Follow-up on file.  Postpartum contraception: Plans IUD  Newborn Data: Live born female  Birth Weight: 6 lb 1.5 oz (2764 g) APGAR: 9, 9  Baby Feeding: Bottle and Breast Disposition:home with mother   04/05/2017 Allie BossierMyra C Lalitha Ilyas, MD

## 2017-04-06 ENCOUNTER — Ambulatory Visit: Payer: Self-pay

## 2017-04-06 LAB — RH IG WORKUP (INCLUDES ABO/RH)
ABO/RH(D): A NEG
Fetal Screen: NEGATIVE
Gestational Age(Wks): 37.4
UNIT DIVISION: 0

## 2017-04-06 NOTE — Discharge Summary (Signed)
OB Discharge Summary     Patient Name: Veronica Carroll DOB: 07-19-1981 MRN: [id number]  Date of admission: 04/04/2017 Delivering MD: Lorne Skeens   Date of discharge: 04/06/2017  Admitting diagnosis: LABOR CHECK Intrauterine pregnancy: [redacted]w[redacted]d     Secondary diagnosis:  Active Problems:   Normal labor  Additional problems: none     Discharge diagnosis: Term Pregnancy Delivered                                                                                                Post partum procedures:none  Augmentation: none  Complications: None  Hospital course:  Onset of Labor With Vaginal Delivery     36 y.o. yo [id number] at [redacted]w[redacted]d was admitted in Active Labor on 04/04/2017. Patient had an uncomplicated labor course as follows:  Membrane Rupture Time/Date: 5:59 AM ,04/04/2017   Intrapartum Procedures: Episiotomy:                                           Lacerations:  2nd degree [3]  Patient had a delivery of a Viable infant. 04/04/2017  Information for the patient's newborn:  Tayjah, [place] Girl Elliannah [[id number]]  Delivery Method: Vaginal, Spontaneous Delivery (Filed from Delivery Summary)    Pateint had an uncomplicated postpartum course.  She is ambulating, tolerating a regular diet, passing flatus, and urinating well. Patient is discharged home in stable condition on 04/06/17.   Physical exam  Vitals:   04/04/17 1300 04/05/17 0514 04/05/17 1812 04/06/17 0542  BP: (!) 118/52 (!) 126/53 (!) 132/56 133/66  Pulse: 78 84 95 88  Resp: 18 17 18 18   Temp: 98.2 F (36.8 C) 97.9 F (36.6 C) 98.6 F (37 C) 98.3 F (36.8 C)  TempSrc: Oral Oral Oral Oral  SpO2: 99%      General: alert, cooperative and no distress Lochia: appropriate Uterine Fundus: firm Incision: N/A DVT Evaluation: No evidence of DVT seen on physical exam. Labs: Lab Results  Component Value Date   WBC 8.2 04/04/2017   HGB 10.0 (L) 04/04/2017   HCT 30.5 (L) 04/04/2017   MCV 75.1 (L) 04/04/2017   PLT 254  04/04/2017   CMP Latest Ref Rng & Units 03/27/2017  Glucose 65 - 99 mg/dL 536(U)  BUN 6 - 20 mg/dL 4(L)  Creatinine 4.40 - 1.00 mg/dL 3.47  Sodium 425 - 956 mmol/L 138  Potassium 3.5 - 5.2 mmol/L 3.9  Chloride 96 - 106 mmol/L 103  CO2 18 - 29 mmol/L 17(L)  Calcium 8.7 - 10.2 mg/dL 8.9  Total Protein 6.0 - 8.5 g/dL 6.3  Total Bilirubin 0.0 - 1.2 mg/dL 0.2  Alkaline Phos 39 - 117 IU/L 122(H)  AST 0 - 40 IU/L 11  ALT 0 - 32 IU/L 7    Discharge instruction: per After Visit Summary and "Baby and Me Booklet".  After visit meds:  Allergies as of 04/06/2017   No Known Allergies     Medication List    STOP taking these medications  glyBURIDE 2.5 MG tablet Commonly known as:  DIABETA     TAKE these medications   ibuprofen 600 MG tablet Commonly known as:  ADVIL,MOTRIN Take 1 tablet (600 mg total) by mouth every 6 (six) hours.   PREPLUS 27-1 MG Tabs Take 1 tablet by mouth daily.   promethazine 25 MG tablet Commonly known as:  PHENERGAN Take 25 mg by mouth every 6 (six) hours as needed for nausea or vomiting.   ranitidine 150 MG tablet Commonly known as:  ZANTAC Take 150 mg by mouth daily.   ZYRTEC ALLERGY 10 MG tablet Generic drug:  cetirizine Take 10 mg by mouth daily.       Diet: routine diet  Activity: Advance as tolerated. Pelvic rest for 6 weeks.   Outpatient follow up:6 weeks Follow up Appt:No future appointments. Follow up Visit:No Follow-up on file.  Postpartum contraception: IUD Mirena  Newborn Data: Live born female  Birth Weight: 6 lb 1.5 oz (2764 g) APGAR: 9, 9  Baby Feeding: Breast Disposition:home with mother   04/06/2017 Wynelle BourgeoisMarie Williams, CNM

## 2017-04-06 NOTE — Lactation Note (Signed)
This note was copied from a baby's chart. Lactation Consultation Note  Patient Name: Veronica Carroll WUJWJ'XToday's Date: 04/06/2017 Reason for consult: Follow-up assessment;Infant < 6lbs;Other (Comment) (Early Term)   Follow up with mom of 3953 hour old infant. Infant with 4 BF for 20 minutes, 1 attempt, 4 formula feeds of 9-35 cc, 4 voids and 4 stools since birth. Infant weight 5 lb 11 oz with 6% weight loss since birth.   Mom reports that she is breast and bottle feeding. She reports she has not been pumping. She is feeling a little fuller today. Reviewed supply and demand, milk coming to volume, and engorgement treatment/prevention. Enc mom to offer breast prior to formula. Mom reports she has a PIS at home to pump, she was encouraged to empty the breasts at least every 3 hours to promote a good milk supply.   Engorgement prevention/treatment, I/O and breast milk handling and storage reviewed. Mom is aware of OP services, BF Support Groups and LC phone #. [facility] and is aware to call and make appt after d/c. Infant with follow up Ped appt tomorrow. Mom without further questions/concerns prn.    Maternal Data Formula Feeding for Exclusion: No Has patient been taught Hand Expression?: Yes  Feeding    LATCH Score/Interventions                      Lactation Tools Discussed/Used WIC Program: Yes   Consult Status Consult Status: Complete Follow-up type: Call as needed    Ed BlalockSharon S Arnelle Nale 04/06/2017, 12:03 PM

## 2017-04-11 ENCOUNTER — Encounter: Payer: Medicaid Other | Admitting: Obstetrics & Gynecology

## 2017-04-11 ENCOUNTER — Encounter: Payer: Medicaid Other | Admitting: Obstetrics and Gynecology

## 2017-04-19 ENCOUNTER — Telehealth: Payer: Self-pay

## 2017-04-19 NOTE — Telephone Encounter (Signed)
Pt c/o green/yellow mucous from nose and frequent nose blowing. Consulted with CNM. Pt to try OTC meds Sudafed, Tylenol Cold medication, Robitussin, Mucinex, or Benadryl but use it sparingly to avoid decrease in milk supply, soup, and rest. If sx's worsen or don't improve after taking OTC as directed, to contact the office. Pt agrees and has no further questions.

## 2017-05-07 ENCOUNTER — Encounter: Payer: Self-pay | Admitting: Obstetrics and Gynecology

## 2017-05-07 ENCOUNTER — Ambulatory Visit (INDEPENDENT_AMBULATORY_CARE_PROVIDER_SITE_OTHER): Payer: Medicaid Other | Admitting: Obstetrics and Gynecology

## 2017-05-07 DIAGNOSIS — Z3689 Encounter for other specified antenatal screening: Secondary | ICD-10-CM | POA: Diagnosis not present

## 2017-05-07 DIAGNOSIS — Z8632 Personal history of gestational diabetes: Secondary | ICD-10-CM | POA: Insufficient documentation

## 2017-05-07 NOTE — Patient Instructions (Signed)

## 2017-05-07 NOTE — Progress Notes (Signed)
Post Partum Exam  Veronica Carroll is a 36 y.o. [phone] female who presents for a postpartum visit. She is 4 weeks postpartum following a spontaneous vaginal delivery. I have fully reviewed the prenatal and intrapartum course. The delivery was at 37.4 gestational weeks.  Anesthesia: none. Postpartum course has been uncomplicated. Baby's course has been uncomplicated. Baby is feeding by breast and bottle. Bleeding no bleeding. Bowel function is normal. Bladder function is normal. Patient is not sexually active. Contraception method is none. Postpartum depression screening:neg score:1  The following portions of the patient's history were reviewed and updated as appropriate: allergies, current medications, past family history, past medical history, past social history and past surgical history.  Review of Systems Pertinent items are noted in HPI.    Objective:  Blood pressure 129/78, pulse (!) 108, weight 178 lb (80.7 kg), unknown if currently breastfeeding.  General:  alert   Breasts:  deferred  Lungs: clear to auscultation bilaterally  Heart:  regular rate and rhythm, S1, S2 normal, no murmur, click, rub or gallop  Abdomen: soft, non-tender; bowel sounds normal; no masses,  no organomegaly   Vulva:  not evaluated  Vagina: not evaluated  Cervix:  not evaluated  Corpus: not examined  Adnexa:  not evaluated  Rectal Exam: Not performed.        Assessment:    Normal PP visit  H/O GDM  Desires IUD   Plan:   Return to nl ADL's Information on ParaGard provided to pt Pt to return in 1 week for insertion Will test glucola d/t H/O GDM

## 2017-05-30 ENCOUNTER — Ambulatory Visit (INDEPENDENT_AMBULATORY_CARE_PROVIDER_SITE_OTHER): Payer: Medicaid Other | Admitting: Obstetrics and Gynecology

## 2017-05-30 ENCOUNTER — Encounter: Payer: Self-pay | Admitting: Obstetrics and Gynecology

## 2017-05-30 VITALS — BP 125/80 | HR 100 | Wt 185.0 lb

## 2017-05-30 DIAGNOSIS — Z30011 Encounter for initial prescription of contraceptive pills: Secondary | ICD-10-CM

## 2017-05-30 MED ORDER — NORETHINDRONE 0.35 MG PO TABS: 1.0000 | ORAL_TABLET | Freq: Every day | ORAL | 11 refills | Status: DC

## 2017-05-30 NOTE — Progress Notes (Signed)
Nurse visit: Pt changed her mind and wishes to start BCP instead of IUD. Pt currently Bfeeding. Denies IC since delivery. BCP sent to [facility] on [address]. Pt agrees.

## 2017-06-03 ENCOUNTER — Other Ambulatory Visit: Payer: Self-pay

## 2017-06-03 ENCOUNTER — Other Ambulatory Visit: Payer: Medicaid Other

## 2017-06-03 DIAGNOSIS — Z8632 Personal history of gestational diabetes: Secondary | ICD-10-CM

## 2017-06-04 LAB — GLUCOSE TOLERANCE, 2 HOURS W/ 1HR
GLUCOSE, 1 HOUR: 171 mg/dL (ref 65–179)
GLUCOSE, 2 HOUR: 115 mg/dL (ref 65–152)
Glucose, Fasting: 97 mg/dL — ABNORMAL HIGH (ref 65–91)

## 2018-02-08 ENCOUNTER — Other Ambulatory Visit: Payer: Self-pay | Admitting: Obstetrics and Gynecology

## 2018-04-02 ENCOUNTER — Encounter (HOSPITAL_COMMUNITY): Payer: Self-pay

## 2018-04-02 ENCOUNTER — Emergency Department (HOSPITAL_COMMUNITY)
Admission: EM | Admit: 2018-04-02 | Discharge: 2018-04-02 | Disposition: A | Discharge status: Home / Self Care | Payer: Non-veteran care | Attending: Emergency Medicine | Admitting: Emergency Medicine

## 2018-04-02 ENCOUNTER — Emergency Department (HOSPITAL_COMMUNITY): Payer: Non-veteran care

## 2018-04-02 DIAGNOSIS — Z79899 Other long term (current) drug therapy: Secondary | ICD-10-CM | POA: Diagnosis not present

## 2018-04-02 DIAGNOSIS — R1031 Right lower quadrant pain: Secondary | ICD-10-CM | POA: Diagnosis present

## 2018-04-02 DIAGNOSIS — N200 Calculus of kidney: Secondary | ICD-10-CM | POA: Insufficient documentation

## 2018-04-02 LAB — URINALYSIS, ROUTINE W REFLEX MICROSCOPIC
BILIRUBIN URINE: NEGATIVE
Glucose, UA: NEGATIVE mg/dL
HGB URINE DIPSTICK: NEGATIVE
KETONES UR: NEGATIVE mg/dL
Leukocytes, UA: NEGATIVE
Nitrite: NEGATIVE
Protein, ur: NEGATIVE mg/dL
SPECIFIC GRAVITY, URINE: 1.034 — AB (ref 1.005–1.030)
pH: 7 (ref 5.0–8.0)

## 2018-04-02 LAB — COMPREHENSIVE METABOLIC PANEL
ALBUMIN: 4 g/dL (ref 3.5–5.0)
ALT: 15 U/L (ref 14–54)
AST: 19 U/L (ref 15–41)
Alkaline Phosphatase: 102 U/L (ref 38–126)
Anion gap: 10 (ref 5–15)
BUN: 10 mg/dL (ref 6–20)
CALCIUM: 9.2 mg/dL (ref 8.9–10.3)
CO2: 24 mmol/L (ref 22–32)
CREATININE: 1.23 mg/dL — AB (ref 0.44–1.00)
Chloride: 104 mmol/L (ref 101–111)
GFR calc Af Amer: >60 mL/min (ref >60)
GFR calc non Af Amer: 55 mL/min — ABNORMAL LOW (ref >60)
GLUCOSE: 165 mg/dL — AB (ref 65–99)
Potassium: 3.9 mmol/L (ref 3.5–5.1)
SODIUM: 138 mmol/L (ref 135–145)
Total Bilirubin: 0.4 mg/dL (ref 0.3–1.2)
Total Protein: 7.6 g/dL (ref 6.5–8.1)

## 2018-04-02 LAB — CBC
HCT: 38 % (ref 36.0–46.0)
Hemoglobin: 12 g/dL (ref 12.0–15.0)
MCH: 26.1 pg (ref 26.0–34.0)
MCHC: 31.6 g/dL (ref 30.0–36.0)
MCV: 82.6 fL (ref 78.0–100.0)
Platelets: 317 10*3/uL (ref 150–400)
RBC: 4.6 MIL/uL (ref 3.87–5.11)
RDW: 13.9 % (ref 11.5–15.5)
WBC: 7.9 10*3/uL (ref 4.0–10.5)

## 2018-04-02 LAB — I-STAT BETA HCG BLOOD, ED (MC, WL, AP ONLY)

## 2018-04-02 LAB — LIPASE, BLOOD: Lipase: 23 U/L (ref 11–51)

## 2018-04-02 MED ORDER — KETOROLAC TROMETHAMINE 30 MG/ML IJ SOLN
30.0000 mg | Freq: Once | INTRAMUSCULAR | Status: AC
  Administered 2018-04-02: 30 mg via INTRAVENOUS
  Filled 2018-04-02: qty 1

## 2018-04-02 MED ORDER — MORPHINE SULFATE (PF) 4 MG/ML IV SOLN
6.0000 mg | Freq: Once | INTRAVENOUS | Status: AC
  Administered 2018-04-02: 6 mg via INTRAVENOUS
  Filled 2018-04-02: qty 2

## 2018-04-02 MED ORDER — OXYCODONE-ACETAMINOPHEN 5-325 MG PO TABS: 1.0000 | ORAL_TABLET | ORAL | 0 refills | Status: DC | PRN

## 2018-04-02 MED ORDER — IOPAMIDOL (ISOVUE-300) INJECTION 61%
100.0000 mL | Freq: Once | INTRAVENOUS | Status: AC | PRN
  Administered 2018-04-02: 100 mL via INTRAVENOUS

## 2018-04-02 MED ORDER — ONDANSETRON 8 MG PO TBDP: 8.0000 mg | ORAL_TABLET | Freq: Three times a day (TID) | ORAL | 0 refills | Status: DC | PRN

## 2018-04-02 MED ORDER — TAMSULOSIN HCL 0.4 MG PO CAPS: 0.4000 mg | ORAL_CAPSULE | Freq: Every day | ORAL | 0 refills | Status: DC

## 2018-04-02 MED ORDER — ONDANSETRON HCL 4 MG/2ML IJ SOLN
4.0000 mg | Freq: Once | INTRAMUSCULAR | Status: AC
  Administered 2018-04-02: 4 mg via INTRAVENOUS
  Filled 2018-04-02: qty 2

## 2018-04-02 MED ORDER — FENTANYL CITRATE (PF) 100 MCG/2ML IJ SOLN
50.0000 ug | Freq: Once | INTRAMUSCULAR | Status: AC
  Administered 2018-04-02: 50 ug via INTRAVENOUS
  Filled 2018-04-02: qty 2

## 2018-04-02 MED ORDER — IOPAMIDOL (ISOVUE-300) INJECTION 61%
INTRAVENOUS | Status: AC
  Filled 2018-04-02: qty 100

## 2018-04-02 MED ORDER — IBUPROFEN 800 MG PO TABS: 800.0000 mg | ORAL_TABLET | Freq: Four times a day (QID) | ORAL | 0 refills | Status: DC | PRN

## 2018-04-02 NOTE — ED Provider Notes (Signed)
[facility]-EMERGENCY DEPT Provider Note   CSN: [id number] Arrival date & time: 04/02/18  0348     History   Chief Complaint Chief Complaint  Patient presents with  . Abdominal Pain    HPI Veronica Carroll is a [age] y.o. female.  HPI Veronica Carroll is a 37 y.o. female presents to ED with complaint of abdominal pain. Pain started 11:30 PM last night. Pain in right flank and all over the abdomen. Associated nausea and vomiting. Pain is constant, 8/10. Worse with movement. Denies urinary symptoms. No changes in bowels.  Did not take any medications prior to coming in.  Denies any prior abdominal surgeries.  No history of similar pain in the past.  Denies any dysuria, hematuria, frequency or urgency.  No fever or chills.  Past Medical History:  Diagnosis Date  . Gestational diabetes     Patient Active Problem List   Diagnosis Date Noted  . History of gestational diabetes 05/07/2017  . Postpartum care and examination 05/07/2017  . Chronic back pain 01/09/2017    Past Surgical History:  Procedure Laterality Date  . FOOT SURGERY    . THERAPEUTIC ABORTION    . WISDOM TOOTH EXTRACTION       OB History    Gravida  4   Para  2   Term  2   Preterm      AB  2   Living  2     SAB  1   TAB  1   Ectopic      Multiple  0   Live Births  2            Home Medications    Prior to Admission medications   Medication Sig Start Date End Date Taking? Authorizing Provider  ACCU-CHEK FASTCLIX LANCETS MISC USE 1 LANCET TO CHECK GLUCOSE 4 TIMES DAILY 02/09/18   Brock BadHarper, Charles A, MD  cetirizine (ZYRTEC ALLERGY) 10 MG tablet Take 10 mg by mouth daily.    [provider]  ibuprofen (ADVIL,MOTRIN) 600 MG tablet Take 1 tablet (600 mg total) by mouth every 6 (six) hours. Patient not taking: Reported on 05/30/2017 04/05/17   Allie Bossierove, Myra C, MD  norethindrone (MICRONOR,CAMILA,ERRIN) 0.35 MG tablet Take 1 tablet (0.35 mg total) by mouth daily. 05/30/17    Hermina StaggersErvin, Michael L, MD  ranitidine (ZANTAC) 150 MG tablet Take 150 mg by mouth daily.    [provider]    Family History Family History  Problem Relation Age of Onset  . Diabetes Mother   . Diabetes Maternal Grandmother   . Diabetes Maternal Grandfather     Social History Social History   Tobacco Use  . Smoking status: Never Smoker  . Smokeless tobacco: Never Used  Substance Use Topics  . Alcohol use: No  . Drug use: No     Allergies   Patient has no known allergies.   Review of Systems Review of Systems  Constitutional: Negative for chills and fever.  Respiratory: Negative for cough, chest tightness and shortness of breath.   Cardiovascular: Negative for chest pain, palpitations and leg swelling.  Gastrointestinal: Positive for abdominal pain, nausea and vomiting. Negative for diarrhea.  Genitourinary: Positive for flank pain. Negative for dysuria, pelvic pain, vaginal bleeding, vaginal discharge and vaginal pain.  Musculoskeletal: Negative for arthralgias, myalgias, neck pain and neck stiffness.  Skin: Negative for rash.  Neurological: Negative for dizziness, weakness and headaches.  All other systems reviewed and are negative.  Physical Exam Updated Vital Signs BP (!) 151/92 (BP Location: Left Arm)   Pulse 90   Temp 98.7 F (37.1 C) (Oral)   Resp 16   Ht 5\' 1"  (1.549 m)   Wt 90.7 kg (200 lb)   LMP 03/17/2018   SpO2 99%   BMI 37.79 kg/m   Physical Exam  Constitutional: She appears well-developed and well-nourished. No distress.  HENT:  Head: Normocephalic.  Eyes: Conjunctivae are normal.  Neck: Neck supple.  Cardiovascular: Normal rate, regular rhythm and normal heart sounds.  Pulmonary/Chest: Effort normal and breath sounds normal. No respiratory distress. She has no wheezes. She has no rales.  Abdominal: Soft. Bowel sounds are normal. She exhibits no distension. There is tenderness in the right upper quadrant, right lower quadrant and  epigastric area. There is no rebound and no CVA tenderness.  Musculoskeletal: She exhibits no edema.  Neurological: She is alert.  Skin: Skin is warm and dry.  Psychiatric: She has a normal mood and affect. Her behavior is normal.  Nursing note and vitals reviewed.    ED Treatments / Results  Labs (all labs ordered are listed, but only abnormal results are displayed) Labs Reviewed  COMPREHENSIVE METABOLIC PANEL - Abnormal; Notable for the following components:      Result Value   Glucose, Bld 165 (*)    Creatinine, Ser 1.23 (*)    GFR calc non Af Amer 55 (*)    All other components within normal limits  LIPASE, BLOOD  CBC  URINALYSIS, ROUTINE W REFLEX MICROSCOPIC  I-STAT BETA HCG BLOOD, ED ([facility], [facility], [facility] ONLY)    EKG None  Radiology Ct Abdomen Pelvis W Contrast  Result Date: 04/02/2018 CLINICAL DATA:  Right-sided flank pain radiating around to the lower abdomen. EXAM: CT ABDOMEN AND PELVIS WITH CONTRAST TECHNIQUE: Multidetector CT imaging of the abdomen and pelvis was performed using the standard protocol following bolus administration of intravenous contrast. CONTRAST:  ISOVUE-300 IOPAMIDOL (ISOVUE-300) INJECTION 61% COMPARISON:  None. FINDINGS: Lower chest: Minimal bibasilar atelectasis.  No acute abnormality. Hepatobiliary: No focal liver abnormality is seen. No gallstones, gallbladder wall thickening, or biliary dilatation. Pancreas: Unremarkable. No pancreatic ductal dilatation or surrounding inflammatory changes. Spleen: Normal in size without focal abnormality. Adrenals/Urinary Tract: The adrenal glands are unremarkable. There is a 5 mm calculus in the mid right ureter with resultant mild right hydroureteronephrosis and perinephric inflammatory changes. Slightly delayed enhancement of the right kidney with respect to the left. 3 mm nonobstructive calculus in the lower pole the left kidney. The bladder is unremarkable. Stomach/Bowel: Small hiatal hernia. The stomach is  otherwise within normal limits. Appendix appears normal. No evidence of bowel wall thickening, distention, or surrounding inflammatory changes. Fecalization of the terminal ileum. Vascular/Lymphatic: No significant vascular findings are present. Subcentimeter retroperitoneal lymph nodes are likely reactive. No enlarged abdominal or pelvic lymph nodes. Reproductive: Uterus and bilateral adnexa are unremarkable. Other: Trace free fluid in the pelvis.  No pneumoperitoneum. Musculoskeletal: No acute or significant osseous findings. IMPRESSION: 1. 5 mm obstructing calculus in the mid right ureter with resultant mild right hydroureteronephrosis. Right perinephric inflammatory changes and slightly delayed enhancement suggest high-grade obstruction. 2. Additional nonobstructive left nephrolithiasis. Electronically Signed   By: Obie Dredge M.D.   On: 04/02/2018 09:07    Procedures Procedures (including critical care time)  Medications Ordered in ED Medications  morphine 4 MG/ML injection 6 mg (6 mg Intravenous Given 04/02/18 0811)  ondansetron (ZOFRAN) injection 4 mg (4 mg Intravenous Given 04/02/18 0811)  iopamidol (ISOVUE-300) 61 % injection 100 mL (100 mLs Intravenous Contrast Given 04/02/18 0843)  fentaNYL (SUBLIMAZE) injection 50 mcg (50 mcg Intravenous Given 04/02/18 0927)  ketorolac (TORADOL) 30 MG/ML injection 30 mg (30 mg Intravenous Given 04/02/18 1610)     Initial Impression / Assessment and Plan / ED Course  I have reviewed the triage vital signs and the nursing notes.  Pertinent labs & imaging results that were available during my care of the patient were reviewed by me and considered in my medical decision making (see chart for details).     Patient to the emergency department with right-sided abdominal pain.  Diffusely tender on the right side with palpation.  Appears to be very uncomfortable.  Morphine ordered for pain.  Vital signs are normal.  Will get CT abdomen and pelvis for further  evaluation.  Labs unremarkable other than slightly elevated creatinine at 1.23.  Normal WBCs.  9:53 AM CT scan showing 5 mm obstructing calculus in the right mid ureter.  Patient's pain improved but still there.  Will give fentanyl and Toradol.  Patient did have slight reaction to morphine resulting in localized hives just proximal to the IV site.  No respiratory distress, no trouble breathing, no diffuse hives.  We will continue to monitor the area.  Urine analysis still pending  10:11 AM Urine with no signs of infection. Pt feels much better after toradol and fentanyl. Will try PO challenge.   Tolerating POs. Stable for dc home. Home with pain meds, antiemetics, follow up with urology  Vitals:   04/02/18 0925 04/02/18 1000 04/02/18 1030 04/02/18 1124  BP: (!) 148/91 (!) 116/94 107/66 121/79  Pulse: 88 89 91 88  Resp: 16     Temp: 97.9 F (36.6 C)   98.7 F (37.1 C)  TempSrc: Oral   Oral  SpO2: 99% 99% 98% 99%  Weight:      Height:         Final Clinical Impressions(s) / ED Diagnoses   Final diagnoses:  Kidney stone on right side    ED Discharge Orders        Ordered    oxyCODONE-acetaminophen (PERCOCET) 5-325 MG tablet  Every 4 hours PRN     04/02/18 1043    ondansetron (ZOFRAN ODT) 8 MG disintegrating tablet  Every 8 hours PRN     04/02/18 1043    ibuprofen (ADVIL,MOTRIN) 800 MG tablet  Every 6 hours PRN     04/02/18 1043    tamsulosin (FLOMAX) 0.4 MG CAPS capsule  Daily     04/02/18 1043       Jaynie Crumble, PA-C 04/02/18 1544    Arby Barrette, MD 04/04/18 1720

## 2018-04-02 NOTE — ED Notes (Addendum)
EDPA Provider at bedside. NO MEDICATIONS GIVEN FOR REACTION TO MORPHINE. PLEASE CHART MORPHINE AS AN ALLERGY.

## 2018-04-02 NOTE — ED Notes (Signed)
Patient transported to CT 

## 2018-04-02 NOTE — Discharge Instructions (Addendum)
Take ibuprofen for pain. Percocet for severe pain. Drink plenty of fluids. Zofran for nausea and vomiting as prescribed. Flomax to help you pass the stone. Follow up with urology. Return if fever, uncontrolled pain, vomiting.

## 2018-04-02 NOTE — ED Triage Notes (Signed)
Pt complains of right sided flank pain that radiates around to the lower abdomen Pt states her pain started about 2330 tonight

## 2018-04-02 NOTE — ED Notes (Signed)
rx x 4 given -  

## 2018-04-02 NOTE — ED Notes (Signed)
Lights down and warm compress given

## 2018-04-06 ENCOUNTER — Encounter (HOSPITAL_COMMUNITY): Payer: Self-pay | Admitting: Emergency Medicine

## 2018-04-06 ENCOUNTER — Emergency Department (HOSPITAL_COMMUNITY): Payer: Medicaid Other

## 2018-04-06 ENCOUNTER — Emergency Department (HOSPITAL_COMMUNITY)
Admission: EM | Admit: 2018-04-06 | Discharge: 2018-04-06 | Disposition: A | Discharge status: Home / Self Care | Payer: Medicaid Other | Attending: Emergency Medicine | Admitting: Emergency Medicine

## 2018-04-06 DIAGNOSIS — Z79899 Other long term (current) drug therapy: Secondary | ICD-10-CM | POA: Diagnosis not present

## 2018-04-06 DIAGNOSIS — N200 Calculus of kidney: Secondary | ICD-10-CM | POA: Diagnosis not present

## 2018-04-06 DIAGNOSIS — R1031 Right lower quadrant pain: Secondary | ICD-10-CM | POA: Diagnosis present

## 2018-04-06 LAB — URINALYSIS, ROUTINE W REFLEX MICROSCOPIC
BILIRUBIN URINE: NEGATIVE
Glucose, UA: NEGATIVE mg/dL
Hgb urine dipstick: NEGATIVE
KETONES UR: NEGATIVE mg/dL
LEUKOCYTES UA: NEGATIVE
NITRITE: NEGATIVE
Protein, ur: NEGATIVE mg/dL
Specific Gravity, Urine: 1.013 (ref 1.005–1.030)
pH: 5 (ref 5.0–8.0)

## 2018-04-06 MED ORDER — ONDANSETRON HCL 4 MG/2ML IJ SOLN
4.0000 mg | Freq: Once | INTRAMUSCULAR | Status: AC
  Administered 2018-04-06: 4 mg via INTRAVENOUS
  Filled 2018-04-06: qty 2

## 2018-04-06 MED ORDER — SODIUM CHLORIDE 0.9 % IV SOLN
INTRAVENOUS | Status: DC
  Administered 2018-04-06: 09:00:00 via INTRAVENOUS

## 2018-04-06 MED ORDER — FENTANYL CITRATE (PF) 100 MCG/2ML IJ SOLN
100.0000 ug | Freq: Once | INTRAMUSCULAR | Status: AC
  Administered 2018-04-06: 100 ug via INTRAVENOUS
  Filled 2018-04-06: qty 2

## 2018-04-06 MED ORDER — KETOROLAC TROMETHAMINE 30 MG/ML IJ SOLN
15.0000 mg | Freq: Once | INTRAMUSCULAR | Status: AC
  Administered 2018-04-06: 15 mg via INTRAVENOUS
  Filled 2018-04-06: qty 1

## 2018-04-06 NOTE — ED Triage Notes (Signed)
Pt c/o kidney stone pains even taking the medications been prescribed for her pains couple days ago.

## 2018-04-06 NOTE — Discharge Instructions (Addendum)
Call the urologist tomorrow and tell them that you have a 5 mm kidney stone and that you need follow-up.  Take 2 tablets of your Percocet every 4 hours as needed for pain

## 2018-04-06 NOTE — ED Provider Notes (Signed)
[facility]-EMERGENCY DEPT Provider Note   CSN: [id number] Arrival date & time: 04/06/18  4098     History   Chief Complaint Chief Complaint  Patient presents with  . Flank Pain    HPI Veronica Carroll is a 37 y.o. female.  This is a 37 year old female presents with persistent right-sided flank pain times several days.  Diagnosed with a 5 mm kidney stone several days ago and has been using Percocet without relief.  Denies any dysuria or hematuria.  No fever or chills.  Pain has been constant and described as sharp.  Denies any vomiting.   Nothing makes her symptoms better     Past Medical History:  Diagnosis Date  . Gestational diabetes     Patient Active Problem List   Diagnosis Date Noted  . History of gestational diabetes 05/07/2017  . Postpartum care and examination 05/07/2017  . Chronic back pain 01/09/2017    Past Surgical History:  Procedure Laterality Date  . FOOT SURGERY    . THERAPEUTIC ABORTION    . WISDOM TOOTH EXTRACTION       OB History    Gravida  4   Para  2   Term  2   Preterm      AB  2   Living  2     SAB  1   TAB  1   Ectopic      Multiple  0   Live Births  2            Home Medications    Prior to Admission medications   Medication Sig Start Date End Date Taking? Authorizing Provider  ibuprofen (ADVIL,MOTRIN) 800 MG tablet Take 1 tablet (800 mg total) by mouth every 6 (six) hours as needed. Patient taking differently: Take 800 mg by mouth every 6 (six) hours as needed for moderate pain.  04/02/18  Yes Kirichenko, Tatyana, PA-C  ondansetron (ZOFRAN ODT) 8 MG disintegrating tablet Take 1 tablet (8 mg total) by mouth every 8 (eight) hours as needed for nausea or vomiting. 04/02/18  Yes Kirichenko, Tatyana, PA-C  oxyCODONE-acetaminophen (PERCOCET) 5-325 MG tablet Take 1 tablet by mouth every 4 (four) hours as needed for severe pain. 04/02/18  Yes Kirichenko, Tatyana, PA-C  tamsulosin (FLOMAX) 0.4 MG CAPS  capsule Take 1 capsule (0.4 mg total) by mouth daily. 04/02/18  Yes Kirichenko, Tatyana, PA-C  ACCU-CHEK FASTCLIX LANCETS MISC USE 1 LANCET TO CHECK GLUCOSE 4 TIMES DAILY 02/09/18   Brock Bad, MD  norethindrone (MICRONOR,CAMILA,ERRIN) 0.35 MG tablet Take 1 tablet (0.35 mg total) by mouth daily. Patient not taking: Reported on 04/02/2018 05/30/17   Hermina Staggers, MD  ranitidine (ZANTAC) 150 MG tablet Take 150 mg by mouth daily as needed for heartburn.     [provider]    Family History Family History  Problem Relation Age of Onset  . Diabetes Mother   . Diabetes Maternal Grandmother   . Diabetes Maternal Grandfather     Social History Social History   Tobacco Use  . Smoking status: Never Smoker  . Smokeless tobacco: Never Used  Substance Use Topics  . Alcohol use: No  . Drug use: No     Allergies   Morphine and related   Review of Systems Review of Systems  All other systems reviewed and are negative.    Physical Exam Updated Vital Signs BP (!) 128/95 (BP Location: Left Arm)   Pulse 67   Temp 98 F (  36.7 C) (Oral)   Resp 17   LMP 03/17/2018   SpO2 100%   Physical Exam  Constitutional: She is oriented to person, place, and time. She appears well-developed and well-nourished.  Non-toxic appearance. No distress.  HENT:  Head: Normocephalic and atraumatic.  Eyes: Pupils are equal, round, and reactive to light. Conjunctivae, EOM and lids are normal.  Neck: Normal range of motion. Neck supple. No tracheal deviation present. No thyroid mass present.  Cardiovascular: Normal rate, regular rhythm and normal heart sounds. Exam reveals no gallop.  No murmur heard. Pulmonary/Chest: Effort normal and breath sounds normal. No stridor. No respiratory distress. She has no decreased breath sounds. She has no wheezes. She has no rhonchi. She has no rales.  Abdominal: Soft. Normal appearance and bowel sounds are normal. She exhibits no distension. There is no  tenderness. There is no rebound and no CVA tenderness.  Musculoskeletal: Normal range of motion. She exhibits no edema or tenderness.  Neurological: She is alert and oriented to person, place, and time. She has normal strength. No cranial nerve deficit or sensory deficit. GCS eye subscore is 4. GCS verbal subscore is 5. GCS motor subscore is 6.  Skin: Skin is warm and dry. No abrasion and no rash noted.  Psychiatric: She has a normal mood and affect. Her speech is normal and behavior is normal.  Nursing note and vitals reviewed.    ED Treatments / Results  Labs (all labs ordered are listed, but only abnormal results are displayed) Labs Reviewed  URINALYSIS, ROUTINE W REFLEX MICROSCOPIC    EKG None  Radiology No results found.  Procedures Procedures (including critical care time)  Medications Ordered in ED Medications - No data to display   Initial Impression / Assessment and Plan / ED Course  I have reviewed the triage vital signs and the nursing notes.  Pertinent labs & imaging results that were available during my care of the patient were reviewed by me and considered in my medical decision making (see chart for details).     Urinalysis negative for infection.  Patient medicated for pain and feels better.  Will follow-up with urology  Final Clinical Impressions(s) / ED Diagnoses   Final diagnoses:  None    ED Discharge Orders    None       Lorre NickAllen, Reinhold Rickey, MD 04/06/18 1154

## 2018-11-28 ENCOUNTER — Inpatient Hospital Stay (EMERGENCY_DEPARTMENT_HOSPITAL)
Admission: AD | Admit: 2018-11-28 | Discharge: 2018-11-28 | Disposition: A | Discharge status: Home / Self Care | Payer: Medicaid Other | Source: Home / Self Care | Attending: Obstetrics and Gynecology | Admitting: Obstetrics and Gynecology

## 2018-11-28 ENCOUNTER — Emergency Department (HOSPITAL_COMMUNITY)
Admission: EM | Admit: 2018-11-28 | Discharge: 2018-11-28 | Disposition: A | Discharge status: Home / Self Care | Payer: Medicaid Other | Attending: Emergency Medicine | Admitting: Emergency Medicine

## 2018-11-28 ENCOUNTER — Other Ambulatory Visit: Payer: Self-pay

## 2018-11-28 ENCOUNTER — Emergency Department (HOSPITAL_COMMUNITY): Payer: Medicaid Other

## 2018-11-28 ENCOUNTER — Encounter (HOSPITAL_COMMUNITY): Payer: Self-pay

## 2018-11-28 DIAGNOSIS — R197 Diarrhea, unspecified: Secondary | ICD-10-CM | POA: Diagnosis present

## 2018-11-28 DIAGNOSIS — F17210 Nicotine dependence, cigarettes, uncomplicated: Secondary | ICD-10-CM | POA: Insufficient documentation

## 2018-11-28 DIAGNOSIS — R1084 Generalized abdominal pain: Secondary | ICD-10-CM | POA: Insufficient documentation

## 2018-11-28 DIAGNOSIS — K529 Noninfective gastroenteritis and colitis, unspecified: Secondary | ICD-10-CM | POA: Insufficient documentation

## 2018-11-28 LAB — COMPREHENSIVE METABOLIC PANEL
ALBUMIN: 3.7 g/dL (ref 3.5–5.0)
ALK PHOS: 75 U/L (ref 38–126)
ALT: 31 U/L (ref 0–44)
ANION GAP: 11 (ref 5–15)
AST: 28 U/L (ref 15–41)
BILIRUBIN TOTAL: 0.4 mg/dL (ref 0.3–1.2)
BUN: 6 mg/dL (ref 6–20)
CALCIUM: 9 mg/dL (ref 8.9–10.3)
CO2: 21 mmol/L — ABNORMAL LOW (ref 22–32)
CREATININE: 0.92 mg/dL (ref 0.44–1.00)
Chloride: 104 mmol/L (ref 98–111)
GFR calc Af Amer: >60 mL/min (ref >60)
GFR calc non Af Amer: >60 mL/min (ref >60)
GLUCOSE: 82 mg/dL (ref 70–99)
Potassium: 3.4 mmol/L — ABNORMAL LOW (ref 3.5–5.1)
Sodium: 136 mmol/L (ref 135–145)
TOTAL PROTEIN: 8 g/dL (ref 6.5–8.1)

## 2018-11-28 LAB — CBC WITH DIFFERENTIAL/PLATELET
Abs Immature Granulocytes: 0.02 10*3/uL (ref 0.00–0.07)
BASOS ABS: 0 10*3/uL (ref 0.0–0.1)
Basophils Relative: 0 %
EOS PCT: 2 %
Eosinophils Absolute: 0.1 10*3/uL (ref 0.0–0.5)
HEMATOCRIT: 37.1 % (ref 36.0–46.0)
HEMOGLOBIN: 11.3 g/dL — AB (ref 12.0–15.0)
Immature Granulocytes: 0 %
LYMPHS ABS: 2.3 10*3/uL (ref 0.7–4.0)
LYMPHS PCT: 33 %
MCH: 25.6 pg — AB (ref 26.0–34.0)
MCHC: 30.5 g/dL (ref 30.0–36.0)
MCV: 84.1 fL (ref 80.0–100.0)
MONO ABS: 0.6 10*3/uL (ref 0.1–1.0)
Monocytes Relative: 9 %
Neutro Abs: 3.8 10*3/uL (ref 1.7–7.7)
Neutrophils Relative %: 56 %
Platelets: 329 10*3/uL (ref 150–400)
RBC: 4.41 MIL/uL (ref 3.87–5.11)
RDW: 15.1 % (ref 11.5–15.5)
WBC: 7 10*3/uL (ref 4.0–10.5)
nRBC: 0 % (ref 0.0–0.2)

## 2018-11-28 LAB — PREGNANCY, URINE: Preg Test, Ur: NEGATIVE

## 2018-11-28 LAB — TYPE AND SCREEN
ABO/RH(D): A NEG
Antibody Screen: NEGATIVE
Weak D: POSITIVE

## 2018-11-28 LAB — POC OCCULT BLOOD, ED: Fecal Occult Bld: POSITIVE — AB

## 2018-11-28 MED ORDER — IOPAMIDOL (ISOVUE-300) INJECTION 61%
INTRAVENOUS | Status: AC
  Filled 2018-11-28: qty 100

## 2018-11-28 MED ORDER — SODIUM CHLORIDE (PF) 0.9 % IJ SOLN
INTRAMUSCULAR | Status: AC
  Filled 2018-11-28: qty 50

## 2018-11-28 MED ORDER — MESALAMINE 400 MG PO CPDR: 800.0000 mg | DELAYED_RELEASE_CAPSULE | Freq: Three times a day (TID) | ORAL | 0 refills | Status: DC

## 2018-11-28 MED ORDER — IOPAMIDOL (ISOVUE-300) INJECTION 61%
100.0000 mL | Freq: Once | INTRAVENOUS | Status: AC | PRN
  Administered 2018-11-28: 100 mL via INTRAVENOUS

## 2018-11-28 NOTE — MAU Note (Signed)
[facility]  Day after Thanksgiving had a stomach virus.  Early Dec, started having stomach cramping, thought it was due to menstrual.  Kept cramping and continued after period.  Had diarrhea, at that time also.  Has continued ever since, sometimes watery, sometimes soft and very mushy.  Today is 3rd day, she had blood running out. Scheduled for a colonoscopy on 2/27.  Dr told her because of the bleeding, to go on to the ER. Does not believe she has hemorroids

## 2018-11-28 NOTE — Discharge Instructions (Addendum)
Take the mesalamine.  Call your gastroenterologist and get the results of your CT scan.

## 2018-11-28 NOTE — ED Triage Notes (Signed)
Pt states she has been having diarrhea since December. Pt states for the last 3 days, she has had blood in her stool, and has noticed blood when wiping. Pt endorses N/V, stomach crmping as well. Pt has colonoscopy scheduled  For 2/27

## 2018-11-28 NOTE — ED Provider Notes (Signed)
Emergency Department Provider Note   I have reviewed the triage vital signs and the nursing notes.   HISTORY  Chief Complaint Diarrhea   HPI Veronica Carroll is a 38 y.o. female without significant past medical history the presents to the emergency department today secondary to blood in her stools.  Patient states that she had some type of GI illness after Thanksgiving and then she had a couple weeks where she felt normal and then she has had off and on again diarrhea for approximately 2 months since then.  Patient states that is usually just watery diarrhea anywhere from 5-8 times a day with no blood within the last 3 days she is noticed quite a bit of blood with it to the point where it is almost all blood in the stool and she does not see any feces anymore.  She has some crampy abdominal pain prior to this but it goes away after having diarrhea.  She had nausea and vomiting as well but not as prominently.  She had episode of having what sounds like conjunctival injection a month ago but no severe pain or loss of vision and it went away without any treatment.  No other associated symptoms.  No family history of inflammatory bowel disease or colon cancer.  Patient has seen her primary doctor and a gastroenterologist which found a lactoferrin that was positive. No other associated or modifying symptoms.    Past Medical History:  Diagnosis Date  . Gestational diabetes     Patient Active Problem List   Diagnosis Date Noted  . History of gestational diabetes 05/07/2017  . Postpartum care and examination 05/07/2017  . Chronic back pain 01/09/2017    Past Surgical History:  Procedure Laterality Date  . FOOT SURGERY    . THERAPEUTIC ABORTION    . WISDOM TOOTH EXTRACTION      Current Outpatient Rx  . Order #: [id number] Class: Historical Med  . Order #: [id number] Class: Historical Med  . Order #: [id number] Class: Historical Med  . Order #: [id number] Class: Historical Med  . Order #:  [id number] Class: Normal  . Order #: [id number] Class: Print  . Order #: [id number] Class: Normal  . Order #: [id number] Class: Print  . Order #: [id number] Class: Print    Allergies Morphine and related  Family History  Problem Relation Age of Onset  . Diabetes Mother   . Diabetes Maternal Grandmother   . Diabetes Maternal Grandfather     Social History Social History   Tobacco Use  . Smoking status: Current Every Day Smoker    Packs/day: 0.10    Types: Cigarettes  . Smokeless tobacco: Never Used  Substance Use Topics  . Alcohol use: No  . Drug use: No    Review of Systems  All other systems negative except as documented in the HPI. All pertinent positives and negatives as reviewed in the HPI. ____________________________________________   PHYSICAL EXAM:  VITAL SIGNS: ED Triage Vitals [11/28/18 1316]  Enc Vitals Group     BP (!) 148/92     Pulse Rate (!) 112     Resp 17     Temp 98.4 F (36.9 C)     Temp Source Oral     SpO2 98 %     Weight 195 lb (88.5 kg)     Height 5\' 1"  (1.549 m)    Constitutional: Alert and oriented. Well appearing and in no acute distress. Eyes: Conjunctivae are normal. PERRL. EOMI. Head: Atraumatic. Nose: No congestion/rhinnorhea. Mouth/Throat: Mucous membranes are  moist.  Oropharynx non-erythematous. Neck: No stridor.  No meningeal signs.   Cardiovascular: Normal rate, regular rhythm. Good peripheral circulation. Grossly normal heart sounds.   Respiratory: Normal respiratory effort.  No retractions. Lungs CTAB. Gastrointestinal: Soft and nontender, no rebound or guarding. No peritonitis. No masses. No distention.  Musculoskeletal: No lower extremity tenderness nor edema. No gross deformities of extremities. Neurologic:  Normal speech and language. No gross focal neurologic deficits are appreciated.  Skin:  Skin is warm, dry and intact. No rash noted.   ____________________________________________   LABS (all labs ordered are listed,  but only abnormal results are displayed)  Labs Reviewed  GASTROINTESTINAL PANEL BY PCR, STOOL (REPLACES STOOL CULTURE)  CBC WITH DIFFERENTIAL/PLATELET  COMPREHENSIVE METABOLIC PANEL  OCCULT BLOOD X 1 CARD TO LAB, STOOL  TYPE AND SCREEN   ____________________________________________  RADIOLOGY  No results found.  ____________________________________________   PROCEDURES  Procedure(s) performed:   Procedures   ____________________________________________   INITIAL IMPRESSION / ASSESSMENT AND PLAN / ED COURSE  Suspect most likely IBD vs IBS vs bacterial colitis vs cancer vs diverticulosis. Will ct/labs for same.   Care transferred to Dr. Rubin PayorPickering pending full workup. HDS at time of care transfer.    Pertinent labs & imaging results that were available during my care of the patient were reviewed by me and considered in my medical decision making (see chart for details).  ____________________________________________  FINAL CLINICAL IMPRESSION(S) / ED DIAGNOSES  Final diagnoses:  None     MEDICATIONS GIVEN DURING THIS VISIT:  Medications - No data to display   NEW OUTPATIENT MEDICATIONS STARTED DURING THIS VISIT:  New Prescriptions   No medications on file    Note:  This note was prepared with assistance of Dragon voice recognition software. Occasional wrong-word or sound-a-like substitutions may have occurred due to the inherent limitations of voice recognition software.   Marily MemosMesner, Kalla Watson, MD 11/28/18 1540

## 2018-11-28 NOTE — MAU Provider Note (Signed)
Ms. Veronica Carroll is a 38 y.o. [id number] who present to MAU today for bloody diarrhea. She reports she has been experiencing diarrhea for the past 2 months since beginning of December, has been followed by her GI doctor at [facility] in [place] who has completed labs. She has colonoscopy scheduled in the next couple of weeks.   She reports bloody diarrhea started occurring 3 days ago, reports straining to have BM over the past 2 weeks then noticed blood in diarrhea and when she wipes after BM.    BP 132/82 (BP Location: Right Arm)   Pulse 93   Temp 98.2 F (36.8 C) (Oral)   Resp 17   Wt 88.5 kg   LMP 11/07/2018   SpO2 99%   BMI 36.84 kg/m  CONSTITUTIONAL: Well-developed, well-nourished female in no acute distress.  CARDIOVASCULAR: Normal heart rate noted RESPIRATORY: Effort and breath sounds normal GASTROINTESTINAL:Soft, no distention noted.  No tenderness, rebound or guarding. Bowel sounds normal.  SKIN: Skin is warm and dry. No rash noted. Not diaphoretic. No erythema. No pallor. PSYCHIATRIC: Normal mood and affect. Normal behavior. Normal judgment and thought content.  MDM Medical screening exam complete Educated and discussed use of imodium for diarrhea and hydration over the next 24 hours. Educated on possibility of hemorrhoids based on clinical symptoms and straining over the past 2 weeks- unlikely to be GI bleed. Encouraged not to strain to have BM and if symptoms worsen to call GI doctor to move up colonoscopy, patient verbalizes understanding.   Pt stable at time of discharge.    A:  1. Diarrhea in adult patient    P: Discharge home Follow up with GI for colonoscopy  Imodium and hydration  Decrease straining    Sharyon Cable, CNM 11/28/2018 1:06 PM

## 2018-11-28 NOTE — Discharge Instructions (Signed)
Chronic Diarrhea Diarrhea is a condition in which a person passes frequent loose and watery stools. It can cause you to feel weak and dehydrated. Dehydration can make you tired and thirsty. It can also cause a dry mouth, decreased urination, and dark yellow urine. Diarrhea is a sign of another underlying problem, such as:  Infection.  Medication side effects.  Dietary intolerance, such as lactose intolerance.  Conditions such as celiac disease, irritable bowel syndrome (IBS), or inflammatory bowel disease (IBD). In most cases, diarrhea lasts 2-3 days. Diarrhea that lasts longer than 4 weeks is called long-lasting (chronic) diarrhea. It is important that you treat your diarrhea as told by your health care provider. Follow these instructions at home: Follow these recommendations as told by your health care provider. Eating and drinking  Take an oral rehydration solution (ORS). This is a drink that is designed to keep you hydrated. It can be found at pharmacies and retail stores.  Drink clear fluids, such as water, ice chips, diluted fruit juice, and low-calorie sports drinks.  Follow the diet recommended by your health care provider. You may need to avoid foods that trigger diarrhea for you.  Avoid foods and beverages that contain a lot of sugar or caffeine.  Avoid alcohol.  Avoid spicy or fatty foods. General instructions      Drink enough fluid to keep your urine clear or pale yellow.  Wash your hands often and after each diarrhea episode. If soap and water are not available, use hand sanitizer.  Make sure that all people in your household wash their hands well and often.  Take over-the-counter and prescription medicines only as told by your health care provider.  If you were prescribed an antibiotic medicine, take it as told by your health care provider. Do not stop taking the antibiotic even if you start to feel better.  Rest at home while you recover.  Watch your  condition for any changes.  Take a warm bath to relieve any burning or pain from frequent diarrhea episodes.  Keep all follow-up visits as told by your health care provider. This is important. Contact a health care provider if:  You have a fever.  Your diarrhea gets worse or does not get better.  You have new symptoms.  You cannot drink fluids without vomiting.  You feel light-headed or dizzy.  You have a headache.  You have muscle cramps.  You have severe pain in the rectum. Get help right away if:  You have persistent vomiting.  You have chest pain.  You feel extremely weak or you faint.  You have bloody or black stools, or stools that look like tar.  You have severe pain, cramping, or bloating in your abdomen, or pain that stays in one place.  You have trouble breathing or you are breathing very quickly.  Your heart is beating very quickly.  Your skin feels cold and clammy.  You feel confused.  You have a severe headache.  You have signs of dehydration, such as: ? Dark urine, very little urine, or no urine. ? Cracked lips. ? Dry mouth. ? Sunken eyes. ? Sleepiness. ? Weakness. Summary  Chronic diarrhea is a condition in which a person passes frequent loose and watery stools for more than 4 weeks.  Diarrhea is a sign of another underlying problem.  Drink enough fluid to keep your urine clear or pale yellow to avoid dehydration.  Wash your hands often and after each diarrhea episode. If soap and water are   not available, use hand sanitizer.  It is important that you treat your diarrhea as told by your health care provider. This information is not intended to replace advice given to you by your health care provider. Make sure you discuss any questions you have with your health care provider. Document Released: 12/29/2003 Document Revised: 08/27/2016 Document Reviewed: 08/27/2016 Elsevier Interactive Patient Education  2019 [employer] Inc[employer]  

## 2018-11-29 LAB — GASTROINTESTINAL PANEL BY PCR, STOOL (REPLACES STOOL CULTURE)

## 2019-06-10 IMAGING — US US RENAL
1 series · 14 of 25 positions shown · non-contrast
Comparison: 04/02/2018

CLINICAL DATA: Right flank pain

EXAM:
RENAL / URINARY TRACT ULTRASOUND COMPLETE

[Series 1: us renal · 14 of 37 slices shown]
[im 1/37]
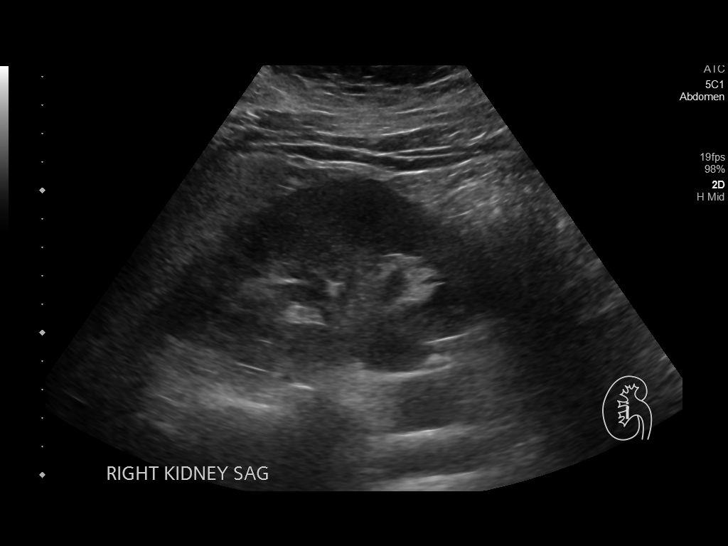
[im 4/37]
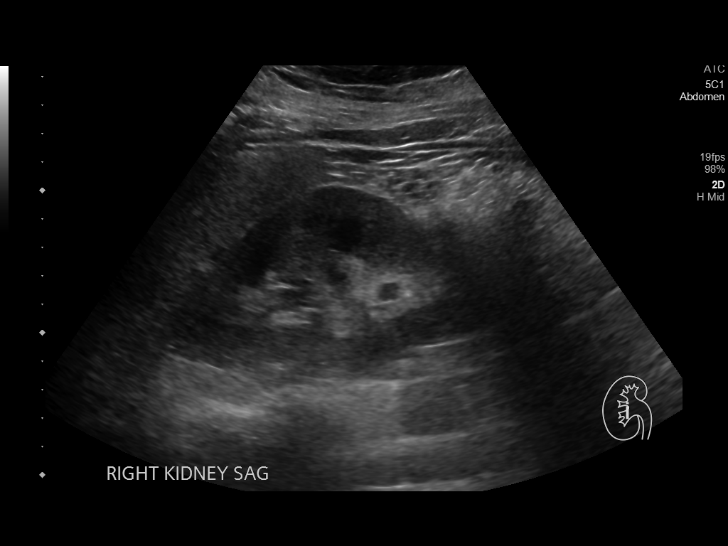
[im 7/37]
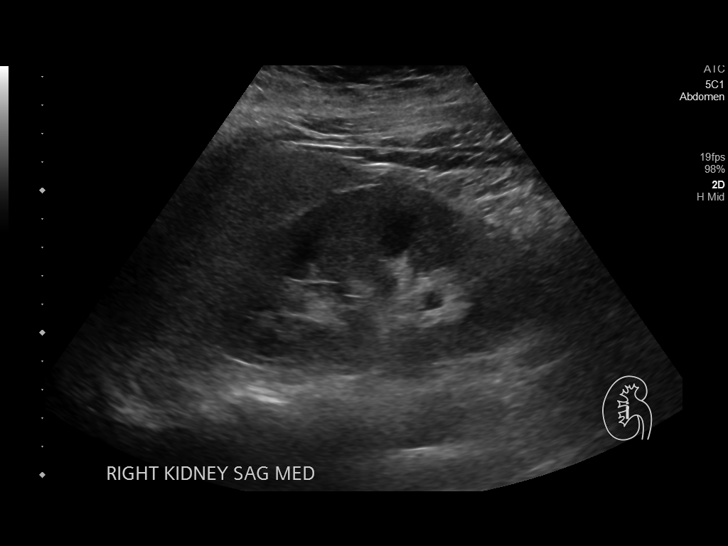
[im 10/37]
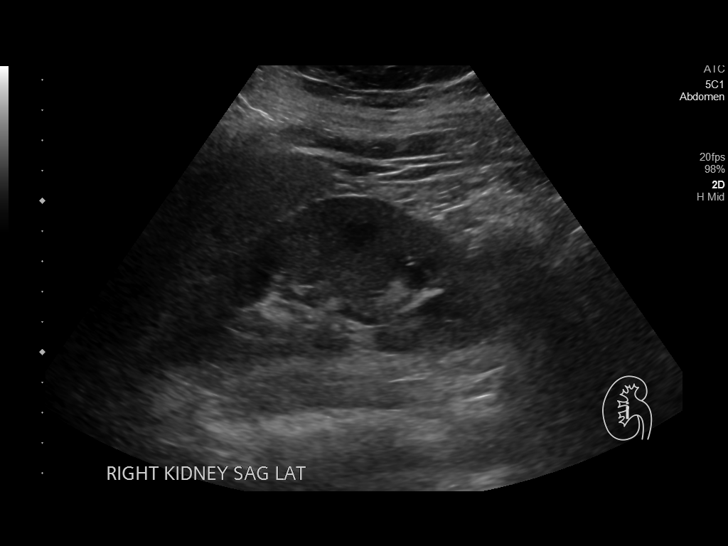
[im 13/37]
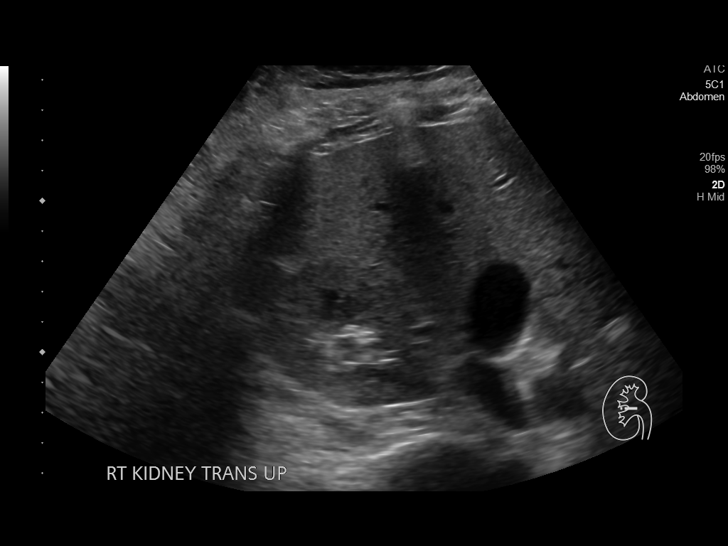
[im 14/37]
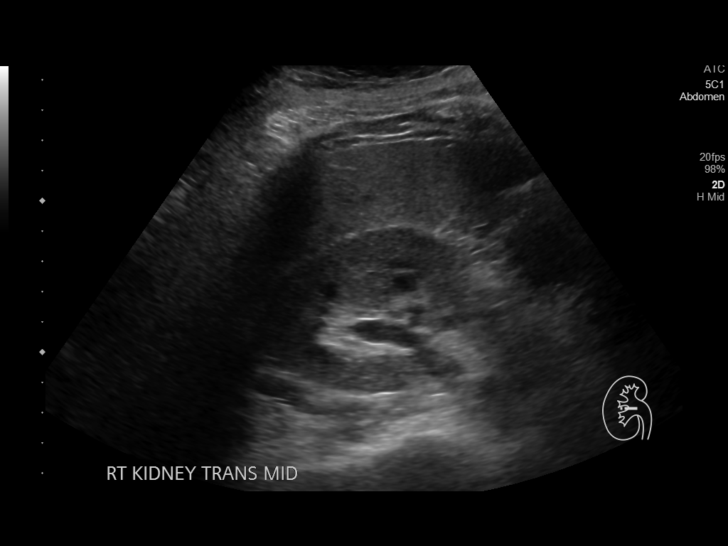
[im 17/37]
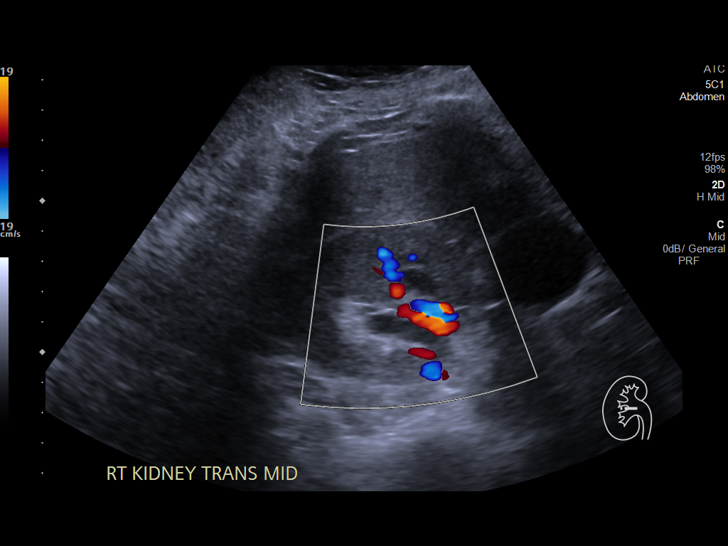
[im 20/37]
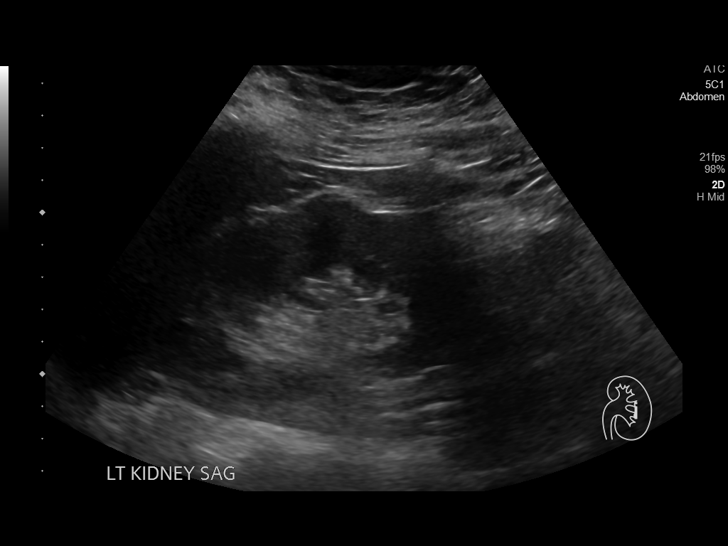
[im 23/37]
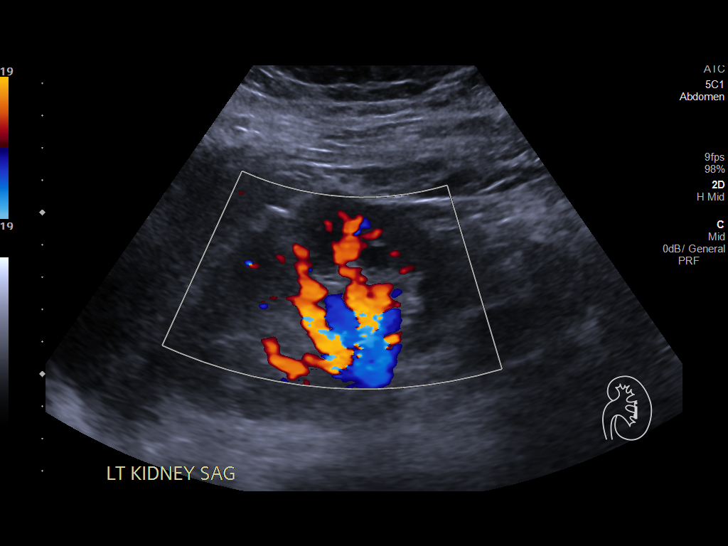
[im 25/37]
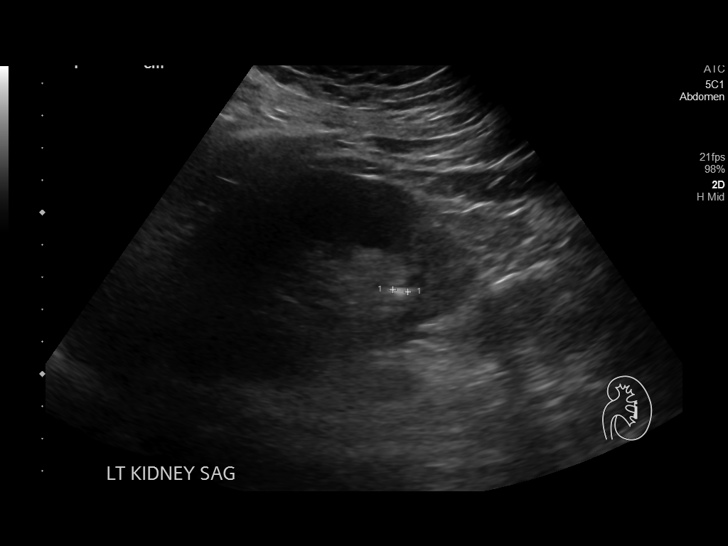
[im 28/37]
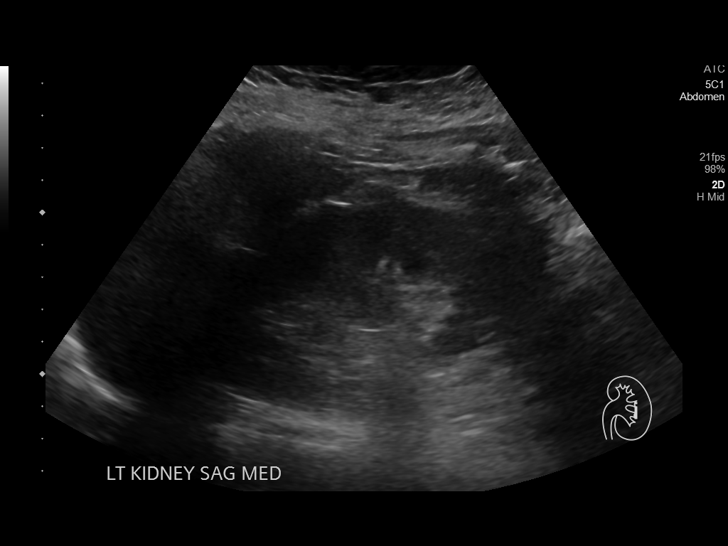
[im 31/37]
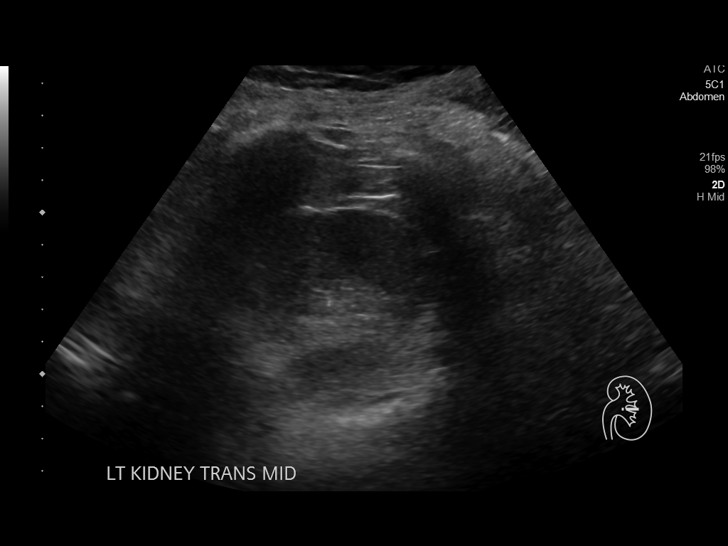
[im 34/37]
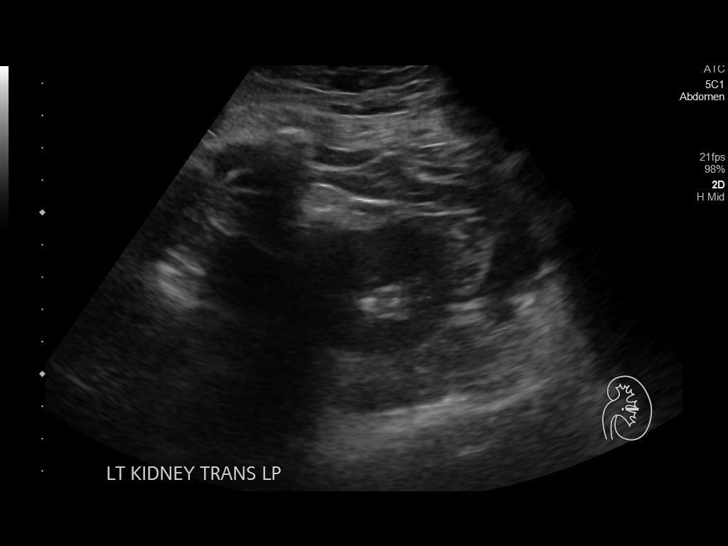
[im 37/37]
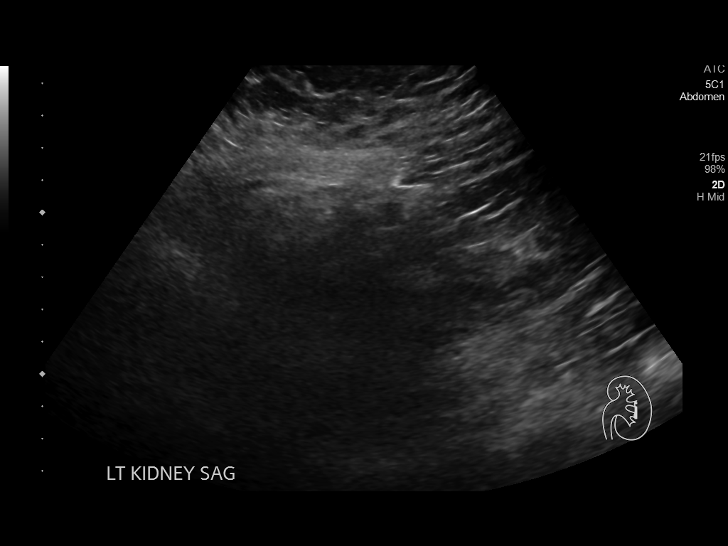

[14 of 25 positions shown; findings below may reference images not displayed]

FINDINGS: Right Kidney:

Length: 12.7 cm. Normal echotexture. Mild hydronephrosis on the
right, likely similar to prior CT. No mass.

Left Kidney:

Length: 11.3 cm. 5 mm lower pole stone. No hydronephrosis..
Echogenicity within normal limits. No mass or hydronephrosis
visualized.

Bladder:

Appears normal for degree of bladder distention.
IMPRESSION: Slight right hydronephrosis, similar to prior CT.

5 mm left lower pole nonobstructing stone.

## 2020-02-01 IMAGING — CT CT ABD-PELV W/ CM
2 of 4 series · 16 of 46 positions shown, 18 images · IV contrast (ISOVUE)
Comparison: 04/02/2018 CT Abdomen and Pelvis

CLINICAL DATA: 37-year-old female with abdominal pain and blood in
stool.

EXAM:
CT ABDOMEN AND PELVIS WITH CONTRAST
TECHNIQUE: Multidetector CT imaging of the abdomen and pelvis was performed
using the standard protocol following bolus administration of
intravenous contrast.
CONTRAST:  100mL MLEPUW-WMM IOPAMIDOL (MLEPUW-WMM) INJECTION 61%

[Series 2: axial st · axial · 0.76mm/px · z∈[-585,-190]mm · 13 of 89 slices shown, 15 images]
[im 5/89  soft-tissue]
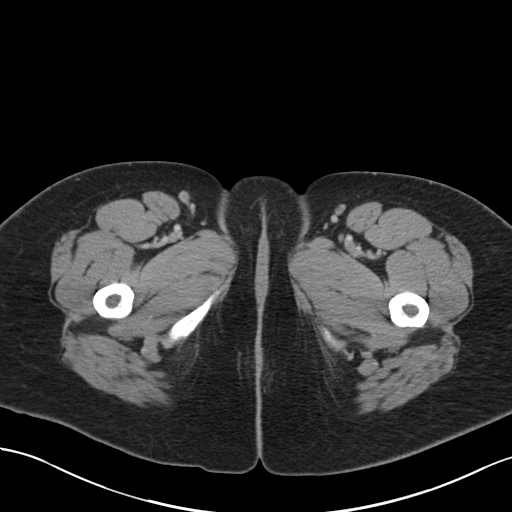
[im 5/89  bone]
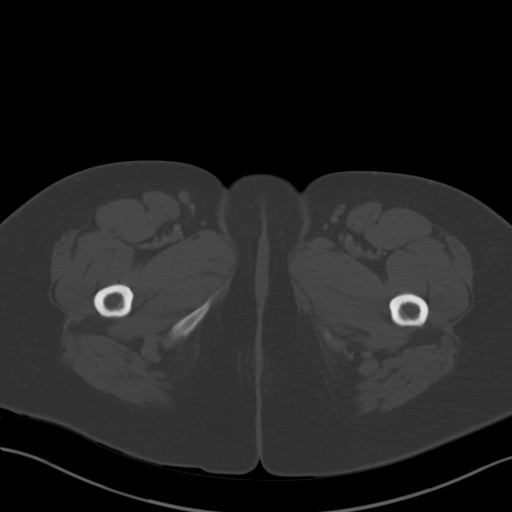
[im 10/89  soft-tissue]
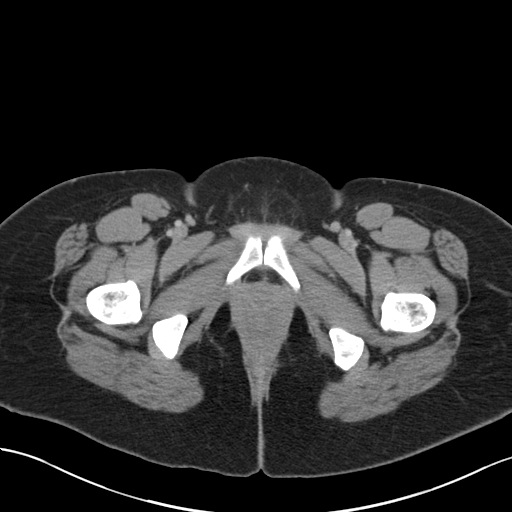
[im 20/89  soft-tissue]
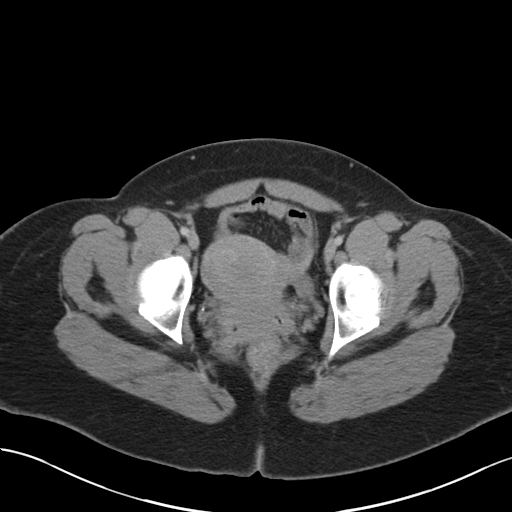
[im 25/89  soft-tissue]
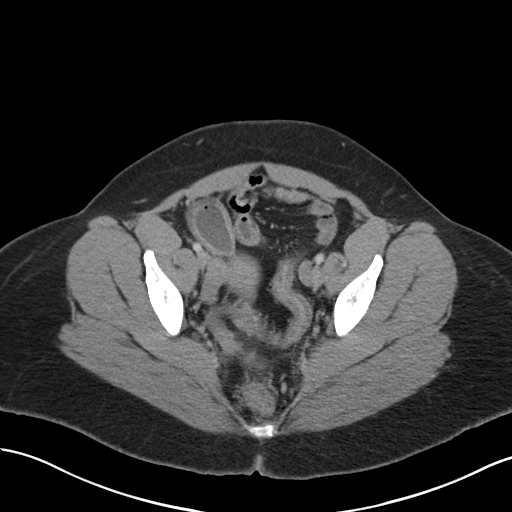
[im 30/89  soft-tissue]
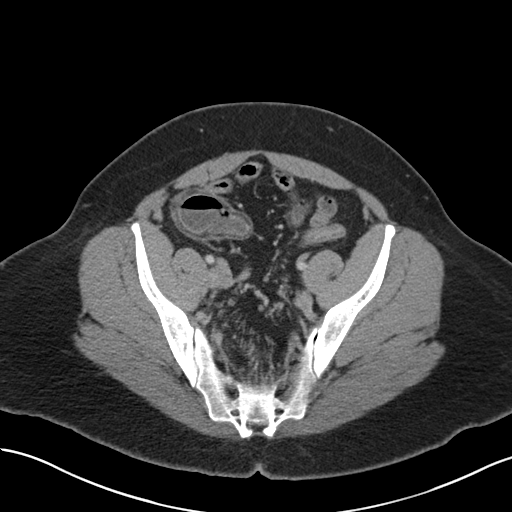
[im 40/89  soft-tissue]
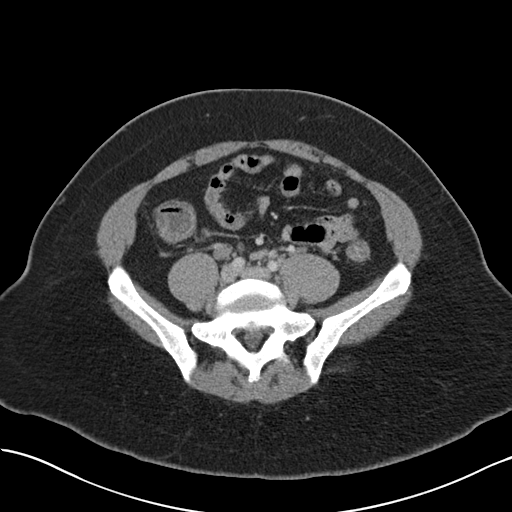
[im 45/89  soft-tissue]
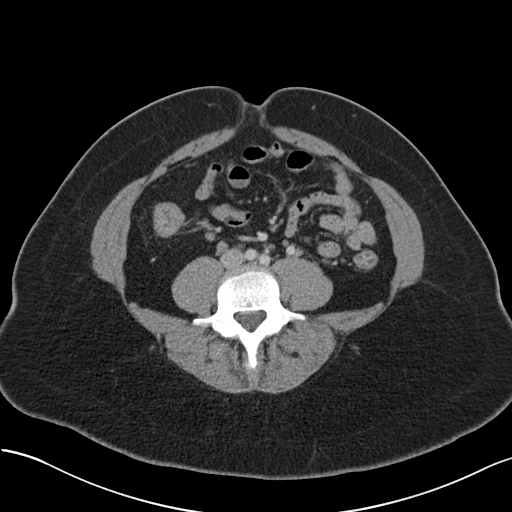
[im 49/89  soft-tissue]
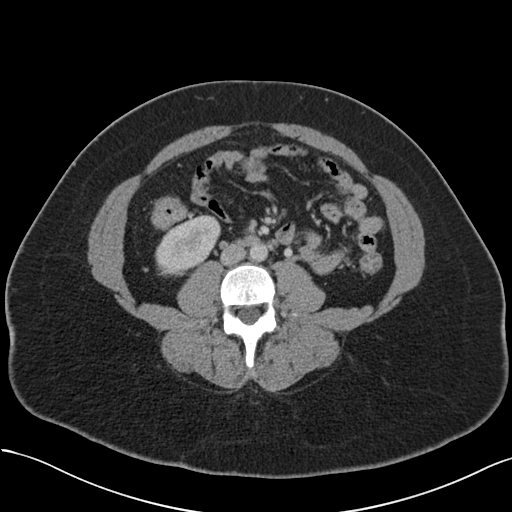
[im 59/89  soft-tissue]
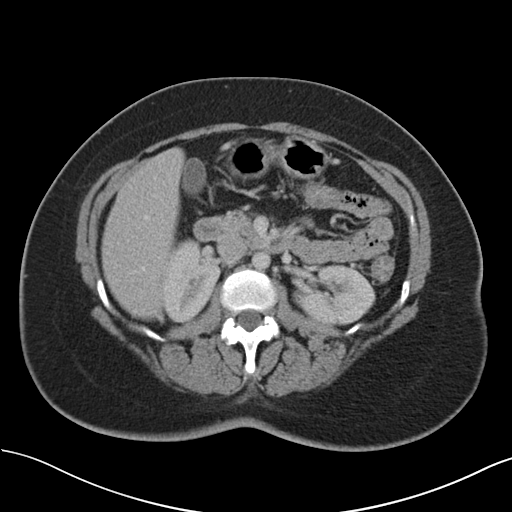
[im 59/89  bone]
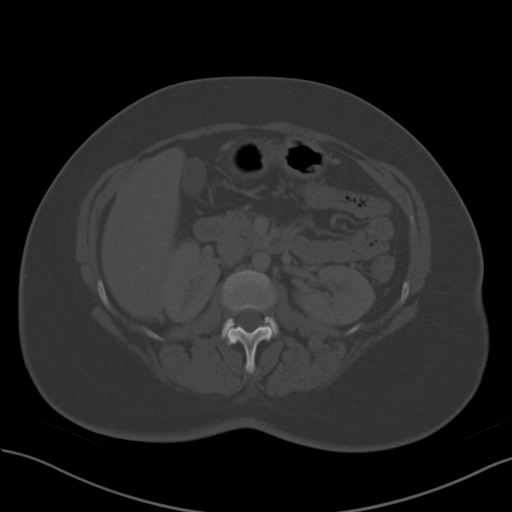
[im 64/89  soft-tissue]
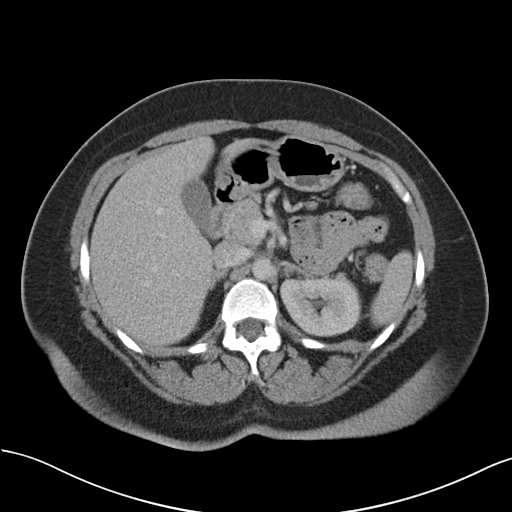
[im 69/89  soft-tissue]
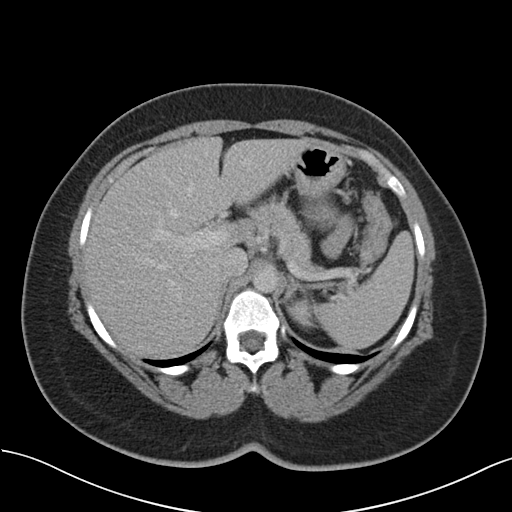
[im 79/89  soft-tissue]
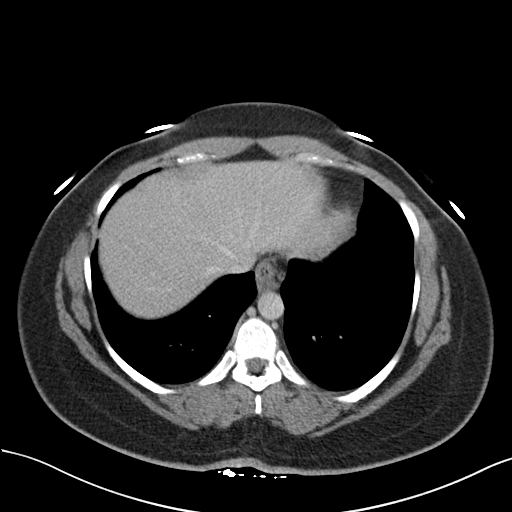
[im 84/89  soft-tissue]
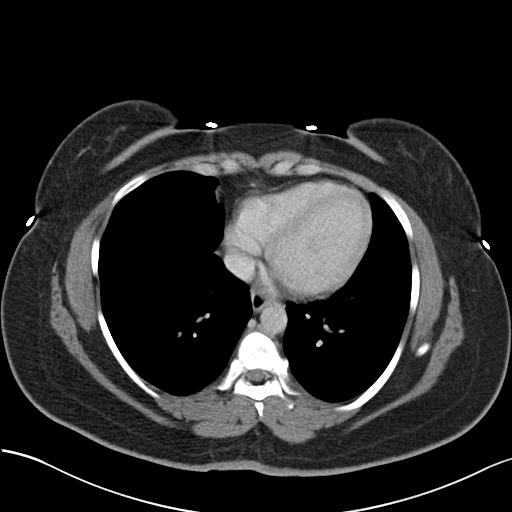

[Series 5: coronal st · coronal · 0.69mm/px · 3 of 95 slices shown]
[im 32/95  soft-tissue]
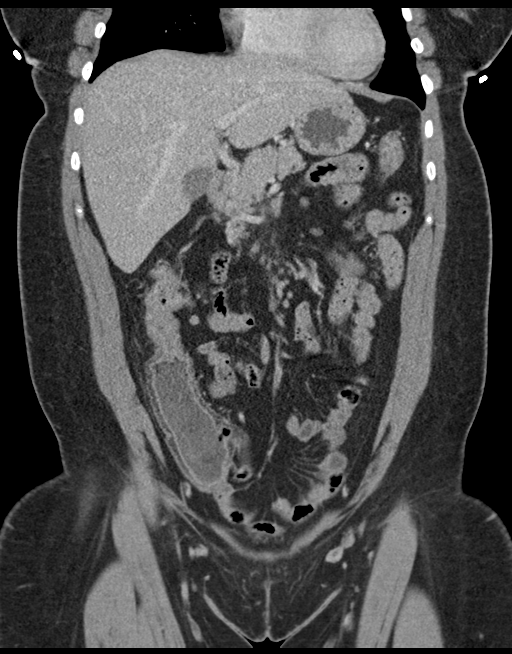
[im 42/95  soft-tissue]
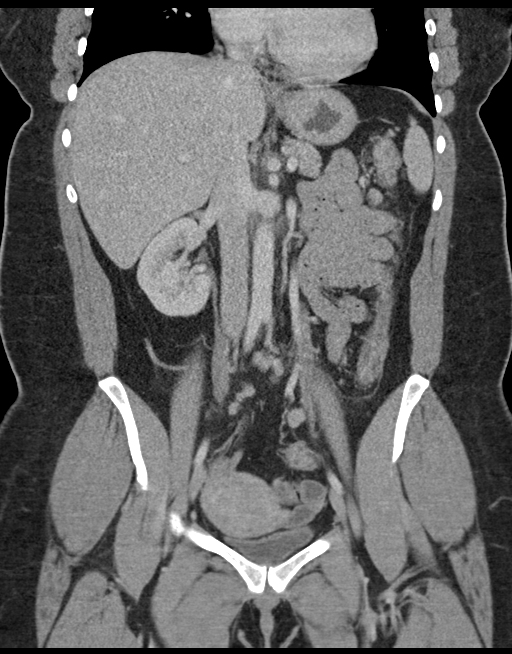
[im 53/95  soft-tissue]
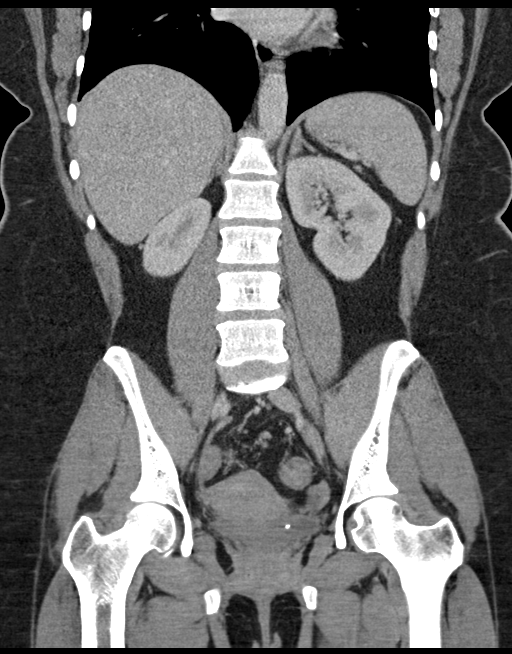

[16 of 46 positions shown; findings below may reference images not displayed]

FINDINGS: Lower chest: Negative, mild elevation of the right hemidiaphragm is
stable (normal variant).

Hepatobiliary: Negative liver and gallbladder.

Pancreas: Negative.

Spleen: Negative.

Adrenals/Urinary Tract: Normal adrenal glands.

Normal kidneys. No hydroureter. However, there is a 3-4 millimeter
calculus located dependently within the urinary bladder (sagittal
image 68). Otherwise diminutive and unremarkable bladder. No other
urinary calculus.

Stomach/Bowel: Diffusely contracted and inflamed appearing large
bowel, although with a paucity of mesenteric stranding. The abnormal
wall thickening continues into the cecum and also the terminal ileum
(both pictured on series 2, image 63). The appendix is spared. From
the abnormal TI the small-bowel transitions quickly to a more normal
appearance.

No superimposed dilated large or small bowel. Negative stomach aside
from possible small gastric hiatal hernia.

No free air, free fluid.

Vascular/Lymphatic: Major arterial structures are patent and appear
normal. Portal venous system is patent.

Reproductive: Negative.

Other: No pelvic free fluid.

Musculoskeletal: No acute osseous abnormality identified. Mild
chronic retrolisthesis of L5 on S1.
IMPRESSION: 1. Diffusely inflamed appearing large bowel with generalized wall
thickening although with no significant mesenteric stranding.
The inflammation also continues into the terminal ileum, although
the upstream small bowel quickly transitions to a more normal
appearance.
The appendix is spared. No bowel obstruction, free fluid or free
air.
Top differential considerations are inflammatory bowel disease
(Crohn disease, Ulcerative Colitis) and infectious enterocolitis.
2. Small urinary calculus within the bladder, but no obstructive
uropathy.

## 2021-04-01 ENCOUNTER — Emergency Department (HOSPITAL_COMMUNITY): Payer: No Typology Code available for payment source

## 2021-04-01 ENCOUNTER — Encounter (HOSPITAL_COMMUNITY): Payer: Self-pay

## 2021-04-01 ENCOUNTER — Other Ambulatory Visit: Payer: Self-pay

## 2021-04-01 ENCOUNTER — Emergency Department (HOSPITAL_COMMUNITY)
Admission: EM | Admit: 2021-04-01 | Discharge: 2021-04-01 | Disposition: A | Discharge status: Home / Self Care | Payer: No Typology Code available for payment source | Attending: Emergency Medicine | Admitting: Emergency Medicine

## 2021-04-01 DIAGNOSIS — R0789 Other chest pain: Secondary | ICD-10-CM | POA: Diagnosis present

## 2021-04-01 DIAGNOSIS — F17210 Nicotine dependence, cigarettes, uncomplicated: Secondary | ICD-10-CM | POA: Diagnosis not present

## 2021-04-01 HISTORY — DX: Noninfective gastroenteritis and colitis, unspecified: K52.9

## 2021-04-01 HISTORY — DX: Ulcerative (chronic) pancolitis without complications: K51.00

## 2021-04-01 LAB — BASIC METABOLIC PANEL
Anion gap: 11 (ref 5–15)
BUN: 10 mg/dL (ref 6–20)
CO2: 22 mmol/L (ref 22–32)
Calcium: 9 mg/dL (ref 8.9–10.3)
Chloride: 105 mmol/L (ref 98–111)
Creatinine, Ser: 0.95 mg/dL (ref 0.44–1.00)
GFR, Estimated: >60 mL/min (ref >60)
Glucose, Bld: 123 mg/dL — ABNORMAL HIGH (ref 70–99)
Potassium: 3.3 mmol/L — ABNORMAL LOW (ref 3.5–5.1)
Sodium: 138 mmol/L (ref 135–145)

## 2021-04-01 LAB — I-STAT BETA HCG BLOOD, ED (MC, WL, AP ONLY): I-stat hCG, quantitative: <5 m[IU]/mL (ref <5)

## 2021-04-01 LAB — CBC
HCT: 39.6 % (ref 36.0–46.0)
Hemoglobin: 12.4 g/dL (ref 12.0–15.0)
MCH: 26.5 pg (ref 26.0–34.0)
MCHC: 31.3 g/dL (ref 30.0–36.0)
MCV: 84.6 fL (ref 80.0–100.0)
Platelets: 329 10*3/uL (ref 150–400)
RBC: 4.68 MIL/uL (ref 3.87–5.11)
RDW: 15.4 % (ref 11.5–15.5)
WBC: 6 10*3/uL (ref 4.0–10.5)
nRBC: 0 % (ref 0.0–0.2)

## 2021-04-01 LAB — TROPONIN I (HIGH SENSITIVITY)
Troponin I (High Sensitivity): 3 ng/L (ref <18)
Troponin I (High Sensitivity): 3 ng/L (ref <18)

## 2021-04-01 MED ORDER — MELOXICAM 7.5 MG PO TABS: 7.5000 mg | ORAL_TABLET | Freq: Every day | ORAL | 0 refills | Status: AC

## 2021-04-01 NOTE — ED Triage Notes (Signed)
Pt reports intermittent central chest pain that began yesterday morning. Pt describes pain as spasms that radiate to jaw and back. Pt endorses SHOB with pain. Pt denies N/V/D and abdominal pain.

## 2021-04-01 NOTE — ED Provider Notes (Signed)
Emergency Medicine Provider Triage Evaluation Note  Veronica Carroll , a 40 y.o. female  was evaluated in triage.  Pt complains of chest pain.  Describes it as a spasm feeling.  Pain is constant, but the short sharp pain comes and goes.  Is not exertional.  When the pain is worse, there is no shortness of breath associated with it.  No history of blood clots, high blood pressure, previous MI.  Review of Systems  Positive: Chest pain, short of breath Negative: Nausea, vomiting  Physical Exam  BP (!) 156/74 (BP Location: Right Arm)   Pulse 68   Temp 98.8 F (37.1 C) (Oral)   Resp 18   LMP 03/15/2021   SpO2 100%  Gen:   Awake, no distress   Resp:  Normal effort  MSK:   Moves extremities without difficulty  Other:  S1-S2 no murmurs rubs or gallops  Medical Decision Making  Medically screening exam initiated at 5:26 PM.  Appropriate orders placed.  Veronica Carroll was informed that the remainder of the evaluation will be completed by another provider, this initial triage assessment does not replace that evaluation, and the importance of remaining in the ED until their evaluation is complete.     Theron Arista, PA-C 04/01/21 1727    Tilden Fossa, MD 04/02/21 [phone]

## 2021-04-01 NOTE — ED Provider Notes (Signed)
[facility]-EMERGENCY DEPT Provider Note   CSN: [id number] Arrival date & time: 04/01/21  1658     History Chief Complaint  Patient presents with   Chest Pain    Veronica Carroll is a 40 y.o. female.  Patient with history of colitis presents the emergency department today for evaluation of chest pain.  She states that she has had pain in the mid chest that started yesterday.  It is described as a "spasm".  It does not radiate.  No associated shortness of breath, vomiting, diaphoresis.  Symptoms can occur at any time and they are not exertional.  No abdominal pain or diarrhea.  She recently had a respiratory illness that her doctor treated with azithromycin.  This had improved but she did have a lot of coughing with this.  Coughing is now resolved.  Patient denies risk factors for pulmonary embolism including: unilateral leg swelling, history of DVT/PE/other blood clots, recent immobilizations, recent surgery, recent travel (>4hr segment), malignancy, hemoptysis.  She does take oral birth control pills.        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[facility])     Patient Active Problem List   Diagnosis Date Noted   History of gestational diabetes 05/07/2017   Postpartum care and examination 05/07/2017   Chronic back pain 01/09/2017    Past Surgical History:  Procedure Laterality Date   FOOT SURGERY     THERAPEUTIC ABORTION     WISDOM TOOTH EXTRACTION       OB History     Gravida  4   Para  2   Term  2   Preterm      AB  2   Living  2      SAB  1   IAB  1   Ectopic      Multiple  0   Live Births  2           Family History  Problem Relation Age of Onset   Diabetes Mother    Diabetes Maternal Grandmother    Diabetes Maternal Grandfather     Social History   Tobacco Use   Smoking status: Every Day    Packs/day: 0.10    Pack years: 0.00    Types: Cigarettes   Smokeless tobacco: Never   Vaping Use   Vaping Use: Never used  Substance Use Topics   Alcohol use: No   Drug use: No    Home Medications Prior to Admission medications   Medication Sig Start Date End Date Taking? Authorizing Provider  norethindrone-ethinyl estradiol-iron (MICROGESTIN FE,GILDESS FE,LOESTRIN FE) 1.5-30 MG-MCG tablet Take 1 tablet by mouth daily.   Yes [provider]  ACCU-CHEK FASTCLIX LANCETS MISC USE 1 LANCET TO CHECK GLUCOSE 4 TIMES DAILY Patient not taking: Reported on 11/28/2018 02/09/18   Brock Bad, MD  cetirizine (ZYRTEC) 10 MG tablet Take 10 mg by mouth daily.    [provider]  ferrous sulfate 325 (65 FE) MG EC tablet Take 325 mg by mouth daily.    [provider]  ibuprofen (ADVIL,MOTRIN) 800 MG tablet Take 1 tablet (800 mg total) by mouth every 6 (six) hours as needed. Patient not taking: Reported on 11/28/2018 04/02/18   Jaynie Crumble, PA-C  meloxicam (MOBIC) 7.5 MG tablet Take 7.5 mg by mouth 2 (two) times daily as needed for pain. Patient not taking: Reported on 04/01/2021    [provider]  Mesalamine (  ASACOL) 400 MG CPDR DR capsule Take 2 capsules (800 mg total) by mouth 3 (three) times daily. Patient not taking: Reported on 04/01/2021 11/28/18   Benjiman Core, MD  norethindrone (MICRONOR,CAMILA,ERRIN) 0.35 MG tablet Take 1 tablet (0.35 mg total) by mouth daily. Patient not taking: No sig reported 05/30/17   Hermina Staggers, MD  ondansetron (ZOFRAN ODT) 8 MG disintegrating tablet Take 1 tablet (8 mg total) by mouth every 8 (eight) hours as needed for nausea or vomiting. Patient not taking: No sig reported 04/02/18   Jaynie Crumble, PA-C  tamsulosin (FLOMAX) 0.4 MG CAPS capsule Take 1 capsule (0.4 mg total) by mouth daily. Patient not taking: No sig reported 04/02/18   Jaynie Crumble, PA-C    Allergies    Morphine and related  Review of Systems   Review of Systems  Constitutional:  Negative for diaphoresis and fever.   Eyes:  Negative for redness.  Respiratory:  Negative for cough and shortness of breath.   Cardiovascular:  Positive for chest pain. Negative for palpitations and leg swelling.  Gastrointestinal:  Negative for abdominal pain, nausea and vomiting.  Genitourinary:  Negative for dysuria.  Musculoskeletal:  Negative for back pain and neck pain.  Skin:  Negative for rash.  Neurological:  Negative for syncope and light-headedness.  Psychiatric/Behavioral:  The patient is not nervous/anxious.    Physical Exam Updated Vital Signs BP 135/71 (BP Location: Left Arm)   Pulse 61   Temp 98.8 F (37.1 C) (Oral)   Resp 17   Ht 5\' 1"  (1.549 m)   Wt 94.3 kg   LMP 03/15/2021   SpO2 99%   BMI 39.30 kg/m   Physical Exam Vitals and nursing note reviewed.  Constitutional:      Appearance: She is well-developed. She is not diaphoretic.  HENT:     Head: Normocephalic and atraumatic.     Mouth/Throat:     Mouth: Mucous membranes are not dry.  Eyes:     Conjunctiva/sclera: Conjunctivae normal.  Neck:     Vascular: Normal carotid pulses. No JVD.     Trachea: Trachea normal. No tracheal deviation.  Cardiovascular:     Rate and Rhythm: Normal rate and regular rhythm.     Pulses: No decreased pulses.          Radial pulses are 2+ on the right side and 2+ on the left side.     Heart sounds: Normal heart sounds, S1 normal and S2 normal. No murmur heard. Pulmonary:     Effort: Pulmonary effort is normal. No respiratory distress.     Breath sounds: No wheezing.  Chest:     Chest wall: Tenderness present.     Comments: Patient is exquisitely tender and winces when I press over the upper sternal area, which she states reproduces her pain. Abdominal:     General: Bowel sounds are normal.     Palpations: Abdomen is soft.     Tenderness: There is no abdominal tenderness. There is no guarding or rebound.  Musculoskeletal:        General: Normal range of motion.     Cervical back: Normal range of motion  and neck supple. No muscular tenderness.     Right lower leg: No tenderness. No edema.     Left lower leg: No tenderness. No edema.     Comments: No physical exam findings concerning for DVT.  Skin:    General: Skin is warm and dry.     Coloration: Skin is  not pale.  Neurological:     Mental Status: She is alert.    ED Results / Procedures / Treatments   Labs (all labs ordered are listed, but only abnormal results are displayed) Labs Reviewed  BASIC METABOLIC PANEL - Abnormal; Notable for the following components:      Result Value   Potassium 3.3 (*)    Glucose, Bld 123 (*)    All other components within normal limits  CBC  I-STAT BETA HCG BLOOD, ED ([facility], [facility], [facility] ONLY)  TROPONIN I (HIGH SENSITIVITY)  TROPONIN I (HIGH SENSITIVITY)    ED ECG REPORT   Date: 04/01/2021  Rate: 63  Rhythm: normal sinus rhythm  QRS Axis: normal  Intervals: normal  ST/T Wave abnormalities: normal  Conduction Disutrbances:none  Narrative Interpretation:   Old EKG Reviewed: none available  I have personally reviewed the EKG tracing and agree with the computerized printout as noted.   Radiology DG Chest 2 View  Result Date: 04/01/2021 CLINICAL DATA:  Chest pain. EXAM: CHEST - 2 VIEW COMPARISON:  None. FINDINGS: The heart size and mediastinal contours are within normal limits. No focal consolidation. No pulmonary edema. No pleural effusion. No pneumothorax. No acute osseous abnormality. IMPRESSION: No active cardiopulmonary disease. Electronically Signed   By: Tish Frederickson M.D.   On: 04/01/2021 17:42    Procedures Procedures   Medications Ordered in ED Medications - No data to display  ED Course  I have reviewed the triage vital signs and the nursing notes.  Pertinent labs & imaging results that were available during my care of the patient were reviewed by me and considered in my medical decision making (see chart for details).  Patient seen and examined. Work-up reviewed, currently  awaiting second troponin.  EKG reviewed personally.  Symptoms are consistent with chest wall pain and/or costochondritis.  If second troponin negative, plan for low-dose NSAID trial, PCP follow-up.  She looks well and is in no distress.  Vital signs reviewed and are as follows: BP 135/71 (BP Location: Left Arm)   Pulse 61   Temp 98.8 F (37.1 C) (Oral)   Resp 17   Ht 5\' 1"  (1.549 m)   Wt 94.3 kg   LMP 03/15/2021   SpO2 99%   BMI 39.30 kg/m   8:43 PM 2nd troponin negative. Plan as above.   8:43 PM Patient was counseled to return with severe chest pain, especially if the pain is crushing or pressure-like and spreads to the arms, back, neck, or jaw, or if they have sweating, nausea, or shortness of breath with the pain. They were encouraged to call 911 with these symptoms.   The patient verbalized understanding and agreed.      MDM Rules/Calculators/A&P                          Patient with no chest pain over the past couple days after recent coughing illness.  She is very tender in her mid chest.  Evaluation for ACS undertaken.  Troponin negative x2.  EKG without any ischemic findings.  Chest x-ray is clear.  No clinical signs or symptoms suggestive of DVT/PE.  She looks well, plan for symptomatic treatment and discharge.   Final Clinical Impression(s) / ED Diagnoses Final diagnoses:  Chest wall pain    Rx / DC Orders ED Discharge Orders          Ordered    meloxicam (MOBIC) 7.5 MG tablet  Daily  04/01/21 2042             Renne CriglerGeiple, Evangelyn Crouse, PA-C 04/01/21 2044    Gwyneth SproutPlunkett, Whitney, MD 04/02/21 [phone]

## 2021-04-01 NOTE — Discharge Instructions (Signed)
Please read and follow all provided instructions.  Your diagnoses today include:  1. Chest wall pain     Tests performed today include: An EKG of your heart A chest x-ray Cardiac enzymes - a blood test for heart muscle damage Blood counts and electrolytes Vital signs. See below for your results today.   Medications prescribed:  Meloxicam - anti-inflammatory pain medication  You have been prescribed an anti-inflammatory medication or NSAID. Take with food. Do not take aspirin, ibuprofen, or naproxen if taking this medication. Take smallest effective dose for the shortest duration needed for your pain. Stop taking if you experience stomach pain or vomiting.   Take any prescribed medications only as directed.  Follow-up instructions: Please follow-up with your primary care provider as soon as you can for further evaluation of your symptoms.   Return instructions:  SEEK IMMEDIATE MEDICAL ATTENTION IF: You have severe chest pain, especially if the pain is crushing or pressure-like and spreads to the arms, back, neck, or jaw, or if you have sweating, nausea (feeling sick to your stomach), or shortness of breath. THIS IS AN EMERGENCY. Don't wait to see if the pain will go away. Get medical help at once. Call 911 or 0 (operator). DO NOT drive yourself to the hospital.  Your chest pain gets worse and does not go away with rest.  You have an attack of chest pain lasting longer than usual, despite rest and treatment with the medications your caregiver has prescribed.  You wake from sleep with chest pain or shortness of breath. You feel dizzy or faint. You have chest pain not typical of your usual pain for which you originally saw your caregiver.  You have any other emergent concerns regarding your health.  Additional Information: Chest pain comes from many different causes. Your caregiver has diagnosed you as having chest pain that is not specific for one problem, but does not require  admission.  You are at low risk for an acute heart condition or other serious illness.   Your vital signs today were: BP 126/79   Pulse 61   Temp 98.8 F (37.1 C) (Oral)   Resp 16   Ht 5\' 1"  (1.549 m)   Wt 94.3 kg   LMP 03/15/2021   SpO2 100%   BMI 39.30 kg/m  If your blood pressure (BP) was elevated above 135/85 this visit, please have this repeated by your doctor within one month. --------------

## 2021-12-12 ENCOUNTER — Other Ambulatory Visit: Payer: Self-pay

## 2021-12-12 ENCOUNTER — Encounter (HOSPITAL_BASED_OUTPATIENT_CLINIC_OR_DEPARTMENT_OTHER): Payer: Self-pay | Admitting: *Deleted

## 2021-12-12 DIAGNOSIS — J069 Acute upper respiratory infection, unspecified: Secondary | ICD-10-CM | POA: Diagnosis not present

## 2021-12-12 DIAGNOSIS — Z20822 Contact with and (suspected) exposure to covid-19: Secondary | ICD-10-CM | POA: Diagnosis not present

## 2021-12-12 DIAGNOSIS — J40 Bronchitis, not specified as acute or chronic: Secondary | ICD-10-CM | POA: Insufficient documentation

## 2021-12-12 DIAGNOSIS — J3489 Other specified disorders of nose and nasal sinuses: Secondary | ICD-10-CM | POA: Diagnosis not present

## 2021-12-12 DIAGNOSIS — R059 Cough, unspecified: Secondary | ICD-10-CM | POA: Diagnosis present

## 2021-12-12 LAB — RESP PANEL BY RT-PCR (FLU A&B, COVID) ARPGX2
Influenza A by PCR: NEGATIVE
Influenza B by PCR: NEGATIVE
SARS Coronavirus 2 by RT PCR: NEGATIVE

## 2021-12-12 MED ORDER — IBUPROFEN 400 MG PO TABS
400.0000 mg | ORAL_TABLET | Freq: Once | ORAL | Status: AC
  Administered 2021-12-12: 400 mg via ORAL
  Filled 2021-12-12: qty 1

## 2021-12-12 NOTE — ED Triage Notes (Signed)
3 days of post nasal drip, headache, sore throat, body aches, sob, cough, fever. Home Covid test was negative.

## 2021-12-13 ENCOUNTER — Emergency Department (HOSPITAL_BASED_OUTPATIENT_CLINIC_OR_DEPARTMENT_OTHER): Payer: No Typology Code available for payment source

## 2021-12-13 ENCOUNTER — Emergency Department (HOSPITAL_BASED_OUTPATIENT_CLINIC_OR_DEPARTMENT_OTHER)
Admission: EM | Admit: 2021-12-13 | Discharge: 2021-12-13 | Disposition: A | Discharge status: Home / Self Care | Payer: No Typology Code available for payment source | Attending: Emergency Medicine | Admitting: Emergency Medicine

## 2021-12-13 DIAGNOSIS — J40 Bronchitis, not specified as acute or chronic: Secondary | ICD-10-CM

## 2021-12-13 DIAGNOSIS — J069 Acute upper respiratory infection, unspecified: Secondary | ICD-10-CM

## 2021-12-13 MED ORDER — IPRATROPIUM-ALBUTEROL 0.5-2.5 (3) MG/3ML IN SOLN
3.0000 mL | Freq: Once | RESPIRATORY_TRACT | Status: AC
  Administered 2021-12-13: 3 mL via RESPIRATORY_TRACT
  Filled 2021-12-13: qty 3

## 2021-12-13 MED ORDER — IBUPROFEN 600 MG PO TABS: 600.0000 mg | ORAL_TABLET | Freq: Four times a day (QID) | ORAL | 0 refills | Status: AC | PRN

## 2021-12-13 MED ORDER — GUAIFENESIN 100 MG/5ML PO LIQD
10.0000 mL | Freq: Once | ORAL | Status: AC
  Administered 2021-12-13: 10 mL via ORAL
  Filled 2021-12-13: qty 10

## 2021-12-13 MED ORDER — ALBUTEROL SULFATE HFA 108 (90 BASE) MCG/ACT IN AERS: 1.0000 | INHALATION_SPRAY | Freq: Four times a day (QID) | RESPIRATORY_TRACT | 0 refills | Status: AC | PRN

## 2021-12-13 MED ORDER — GUAIFENESIN-DM 100-10 MG/5ML PO SYRP: 10.0000 mL | ORAL_SOLUTION | ORAL | 0 refills | Status: AC | PRN

## 2021-12-13 NOTE — ED Provider Notes (Signed)
[facility] EMERGENCY DEPARTMENT Provider Note   CSN: [id number] Arrival date & time: 12/12/21  2140     History  No chief complaint on file.   Veronica Carroll is a 41 y.o. female.  Pt is a 41 yo female presenting for nasal congestion, sore throat, cough, wheezing, and sob. Pt states symptoms started approx 3 days ago. Admits to fever.   The history is provided by the patient. No language interpreter was used.      Home Medications Prior to Admission medications   Medication Sig Start Date End Date Taking? Authorizing Provider  cetirizine (ZYRTEC) 10 MG tablet Take 10 mg by mouth daily as needed for allergies.   Yes [provider]  meloxicam (MOBIC) 7.5 MG tablet Take 1 tablet (7.5 mg total) by mouth daily. 04/01/21  Yes Carlisle Cater, PA-C  norethindrone-ethinyl estradiol-iron (MICROGESTIN FE,GILDESS FE,LOESTRIN FE) 1.5-30 MG-MCG tablet Take 1 tablet by mouth daily.    [provider]      Allergies    Morphine and related    Review of Systems   Review of Systems  Constitutional:  Positive for fever. Negative for chills.  HENT:  Positive for congestion, rhinorrhea and sore throat. Negative for ear pain.   Eyes:  Negative for pain and visual disturbance.  Respiratory:  Positive for cough, shortness of breath and wheezing.   Cardiovascular:  Negative for chest pain and palpitations.  Gastrointestinal:  Negative for abdominal pain and vomiting.  Genitourinary:  Negative for dysuria and hematuria.  Musculoskeletal:  Negative for arthralgias and back pain.  Skin:  Negative for color change and rash.  Neurological:  Negative for seizures and syncope.  All other systems reviewed and are negative.  Physical Exam Updated Vital Signs BP 116/73    Pulse (!) 111    Temp 100.2 F (37.9 C) (Oral)    Resp 18    Ht 5\' 1"  (1.549 m)    Wt 94.8 kg    LMP 11/16/2021    SpO2 98%    BMI 39.49 kg/m  Physical Exam Vitals and nursing note reviewed.   Constitutional:      General: She is not in acute distress.    Appearance: She is well-developed.  HENT:     Head: Normocephalic and atraumatic.  Eyes:     Conjunctiva/sclera: Conjunctivae normal.  Cardiovascular:     Rate and Rhythm: Normal rate and regular rhythm.     Heart sounds: No murmur heard. Pulmonary:     Effort: Pulmonary effort is normal. No respiratory distress.     Breath sounds: Normal breath sounds.  Abdominal:     Palpations: Abdomen is soft.     Tenderness: There is no abdominal tenderness.  Musculoskeletal:        General: No swelling.     Cervical back: Neck supple.  Skin:    General: Skin is warm and dry.     Capillary Refill: Capillary refill takes less than 2 seconds.  Neurological:     Mental Status: She is alert.  Psychiatric:        Mood and Affect: Mood normal.    ED Results / Procedures / Treatments   Labs (all labs ordered are listed, but only abnormal results are displayed) Labs Reviewed  RESP PANEL BY RT-PCR (FLU A&B, COVID) [id number]    EKG None  Radiology No results found.  Procedures Procedures    Medications Ordered in ED Medications  ibuprofen (ADVIL) tablet 400 mg (400 mg Oral  Given 12/12/21 2157)    ED Course/ Medical Decision Making/ A&P                           Medical Decision Making Amount and/or Complexity of Data Reviewed Radiology: ordered.  Risk OTC drugs. Prescription drug management.   3:04 AM 41 yo female presenting for nasal congestion, sore throat, cough, wheezing, and sob. Pt is Aox3, no acute distress, febrile at 100.2 F, with otherwise stable vitals. Physical exam demonstrates clear equal breath sounds with no adventitious lung sounds. No signs of respiratory distress. No hypoxia.   Treatment: Pt given deuoneb treatment for reported wheezing and chest heaviness. Robitussin given for coughing.   Pertinent Labs/Tests: CXR demonstrate no acute process. No focal pneumonia. No leukocytosis. ECG stable  with no ST segment elevation or depression.   Symptom onset 3 day ago favoring dx of  viral URI with cough. Prescriptions sent to pharmacy.   On re-evaluation, pt admits to improvement after duoneb treatment. Albuterol inhaler given with prescription for cough suppressants.  Patient in no distress and overall condition improved here in the ED. Detailed discussions were had with the patient regarding current findings, and need for close f/u with PCP or on call doctor. The patient has been instructed to return immediately if the symptoms worsen in any way for re-evaluation. Patient verbalized understanding and is in agreement with current care plan. All questions answered prior to discharge.         Final Clinical Impression(s) / ED Diagnoses Final diagnoses:  Viral URI with cough  Bronchitis    Rx / DC Orders ED Discharge Orders     None         Lianne Cure, DO [id number]

## 2021-12-13 NOTE — ED Notes (Signed)
Patient verbalizes understanding of discharge instructions. Opportunity for questioning and answers were provided. Armband removed by staff, pt discharged from ED. Ambulated out to lobby  

## 2023-02-16 IMAGING — DX DG CHEST 1V PORT
1 series · 1 of 1 positions shown · non-contrast
Comparison: 04/01/2021

CLINICAL DATA: Shortness of breath and cough

EXAM:
PORTABLE CHEST 1 VIEW

[chest ap]
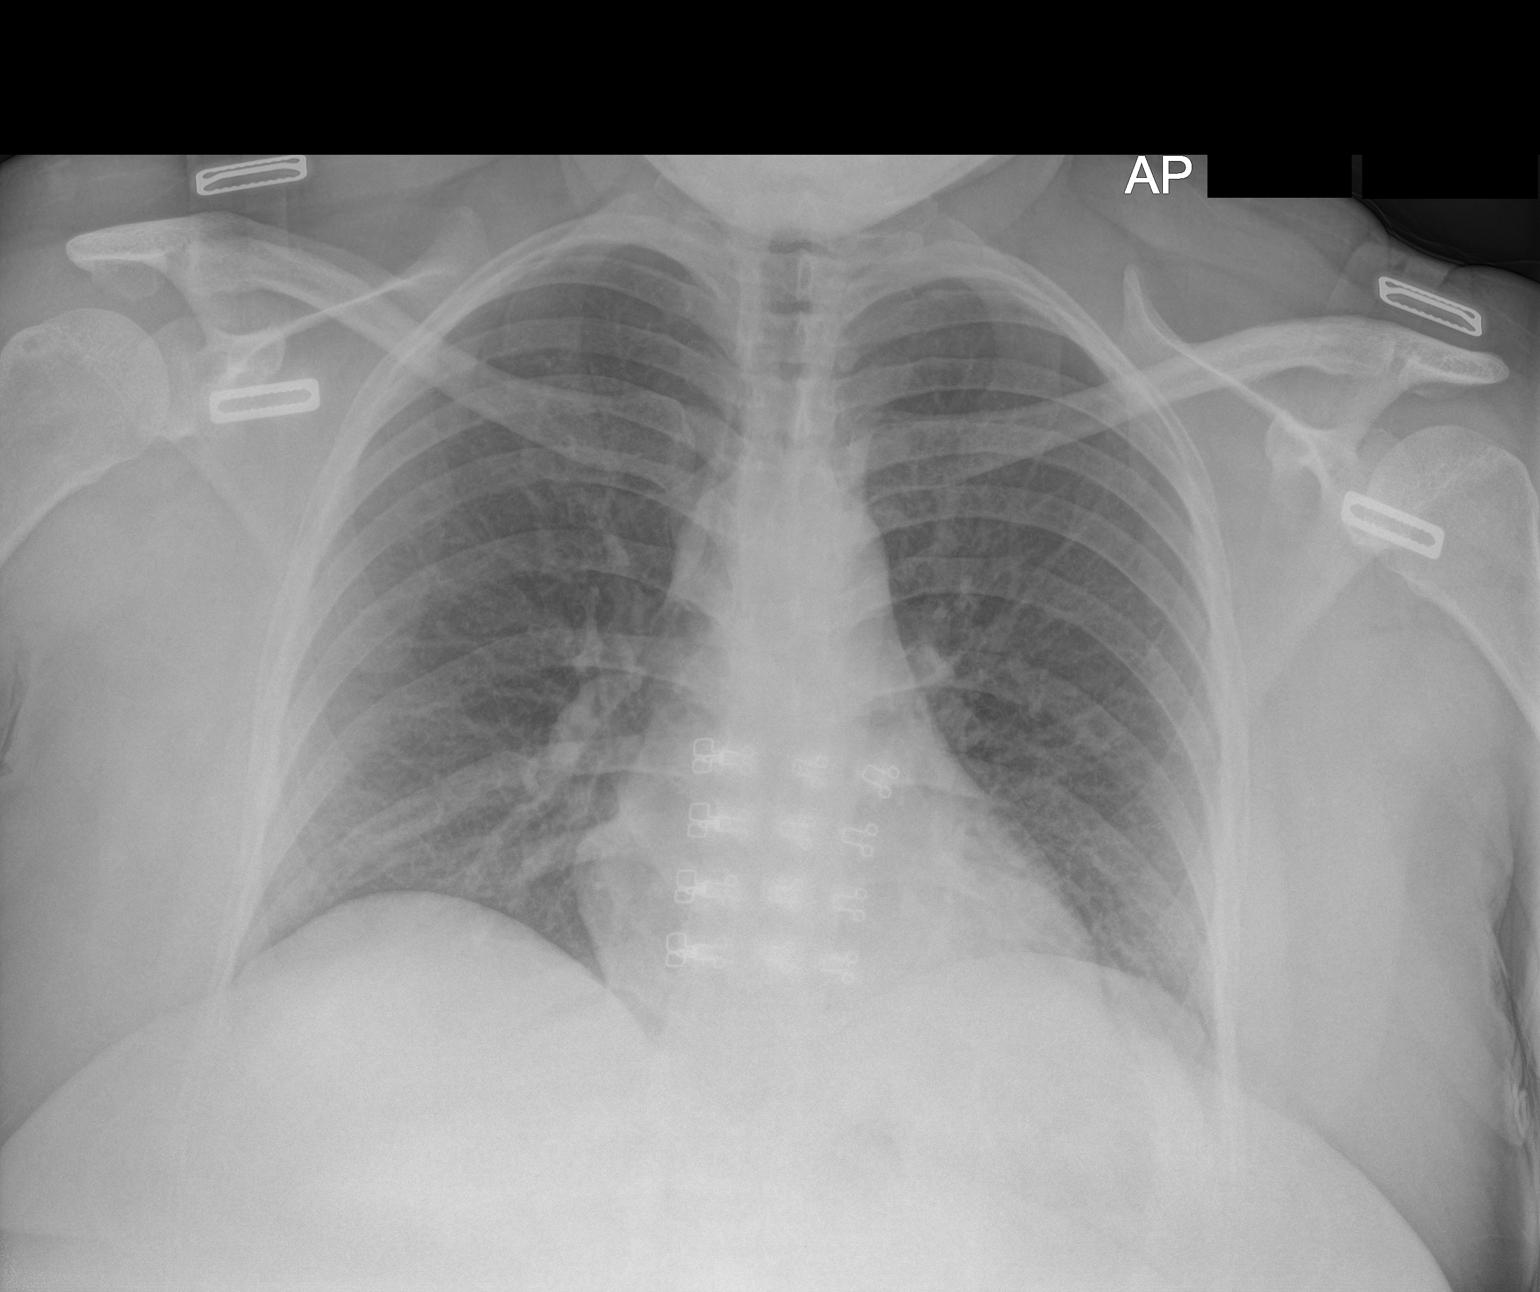

[1 of 1 positions shown; findings below may reference images not displayed]

FINDINGS: Generalized interstitial prominence. No Kerley lines, effusion, or
pneumothorax. No air bronchograms. Normal heart size and mediastinal
contours.
IMPRESSION: Prominent interstitial markings compared to prior, question
bronchitis.

## 2023-05-14 ENCOUNTER — Other Ambulatory Visit: Payer: Self-pay

## 2023-05-14 ENCOUNTER — Emergency Department (HOSPITAL_BASED_OUTPATIENT_CLINIC_OR_DEPARTMENT_OTHER)
Admission: EM | Admit: 2023-05-14 | Discharge: 2023-05-14 | Disposition: A | Discharge status: Home / Self Care | Payer: No Typology Code available for payment source | Attending: Emergency Medicine | Admitting: Emergency Medicine

## 2023-05-14 ENCOUNTER — Encounter (HOSPITAL_BASED_OUTPATIENT_CLINIC_OR_DEPARTMENT_OTHER): Payer: Self-pay | Admitting: Emergency Medicine

## 2023-05-14 DIAGNOSIS — Z1152 Encounter for screening for COVID-19: Secondary | ICD-10-CM | POA: Insufficient documentation

## 2023-05-14 DIAGNOSIS — M791 Myalgia, unspecified site: Secondary | ICD-10-CM | POA: Insufficient documentation

## 2023-05-14 LAB — CBC WITH DIFFERENTIAL/PLATELET
Abs Immature Granulocytes: 0.01 10*3/uL (ref 0.00–0.07)
Basophils Absolute: 0 10*3/uL (ref 0.0–0.1)
Basophils Relative: 0 %
Eosinophils Absolute: 0.1 10*3/uL (ref 0.0–0.5)
Eosinophils Relative: 1 %
HCT: 36.5 % (ref 36.0–46.0)
Hemoglobin: 11.3 g/dL — ABNORMAL LOW (ref 12.0–15.0)
Immature Granulocytes: 0 %
Lymphocytes Relative: 39 %
Lymphs Abs: 2.4 10*3/uL (ref 0.7–4.0)
MCH: 25.2 pg — ABNORMAL LOW (ref 26.0–34.0)
MCHC: 31 g/dL (ref 30.0–36.0)
MCV: 81.5 fL (ref 80.0–100.0)
Monocytes Absolute: 0.6 10*3/uL (ref 0.1–1.0)
Monocytes Relative: 9 %
Neutro Abs: 3 10*3/uL (ref 1.7–7.7)
Neutrophils Relative %: 51 %
Platelets: 200 10*3/uL (ref 150–400)
RBC: 4.48 MIL/uL (ref 3.87–5.11)
RDW: 15.9 % — ABNORMAL HIGH (ref 11.5–15.5)
WBC: 6.1 10*3/uL (ref 4.0–10.5)
nRBC: 0 % (ref 0.0–0.2)

## 2023-05-14 LAB — C-REACTIVE PROTEIN: CRP: 0.8 mg/dL (ref <1.0)

## 2023-05-14 LAB — TROPONIN I (HIGH SENSITIVITY): Troponin I (High Sensitivity): 3 ng/L (ref <18)

## 2023-05-14 LAB — BASIC METABOLIC PANEL
Anion gap: 9 (ref 5–15)
BUN: 11 mg/dL (ref 6–20)
CO2: 23 mmol/L (ref 22–32)
Calcium: 9.1 mg/dL (ref 8.9–10.3)
Chloride: 104 mmol/L (ref 98–111)
Creatinine, Ser: 1.01 mg/dL — ABNORMAL HIGH (ref 0.44–1.00)
GFR, Estimated: >60 mL/min (ref >60)
Glucose, Bld: 161 mg/dL — ABNORMAL HIGH (ref 70–99)
Potassium: 3.7 mmol/L (ref 3.5–5.1)
Sodium: 136 mmol/L (ref 135–145)

## 2023-05-14 LAB — CK: Total CK: 957 U/L — ABNORMAL HIGH (ref 38–234)

## 2023-05-14 LAB — SEDIMENTATION RATE: Sed Rate: 27 mm/hr — ABNORMAL HIGH (ref 0–22)

## 2023-05-14 LAB — SARS CORONAVIRUS 2 BY RT PCR: SARS Coronavirus 2 by RT PCR: NEGATIVE

## 2023-05-14 MED ORDER — FENTANYL CITRATE PF 50 MCG/ML IJ SOSY
50.0000 ug | PREFILLED_SYRINGE | Freq: Once | INTRAMUSCULAR | Status: AC
  Administered 2023-05-14: 50 ug via INTRAVENOUS
  Filled 2023-05-14: qty 1

## 2023-05-14 MED ORDER — NAPROXEN 500 MG PO TABS: 500.0000 mg | ORAL_TABLET | Freq: Two times a day (BID) | ORAL | 0 refills | Status: AC | PRN

## 2023-05-14 MED ORDER — DIAZEPAM 5 MG/ML IJ SOLN
2.5000 mg | Freq: Once | INTRAMUSCULAR | Status: AC
  Administered 2023-05-14: 2.5 mg via INTRAVENOUS
  Filled 2023-05-14: qty 2

## 2023-05-14 MED ORDER — SODIUM CHLORIDE 0.9 % IV BOLUS
1000.0000 mL | Freq: Once | INTRAVENOUS | Status: AC
  Administered 2023-05-14: 1000 mL via INTRAVENOUS

## 2023-05-14 MED ORDER — KETOROLAC TROMETHAMINE 30 MG/ML IJ SOLN
15.0000 mg | Freq: Once | INTRAMUSCULAR | Status: AC
  Administered 2023-05-14: 15 mg via INTRAVENOUS
  Filled 2023-05-14: qty 1

## 2023-05-14 MED ORDER — METHOCARBAMOL 500 MG PO TABS: 500.0000 mg | ORAL_TABLET | Freq: Three times a day (TID) | ORAL | 0 refills | Status: AC | PRN

## 2023-05-14 NOTE — ED Provider Notes (Signed)
[place] Provider Note   CSN: [id number] Arrival date & time: 05/14/23  0144     History  Chief Complaint  Patient presents with   Muscle Pain    Veronica Carroll is a 42 y.o. female.  Patient reports "muscle pain" that onset today.  States this started in her right wrist and her right hand then progressed all the way up her arm to her neck and shoulders.  No involving her left arm, wrist, bilateral knees, toes, shins and hands.  Had similar symptoms like few months ago was seen at the [place] without a clear diagnosis.  No rheumatological history.  Does have chronic disease and takes monthly injections.  No fevers, chills, nausea, vomiting, chest pain or shortness of breath.  No abdominal pain.  No cough, runny nose or sore throat. Feels sore and achy all over especially to her bilateral hands, elbows, wrists and shoulders.  The history is provided by the patient.  Muscle Pain Pertinent negatives include no abdominal pain, no headaches and no shortness of breath.       Home Medications Prior to Admission medications   Medication Sig Start Date End Date Taking? Authorizing Provider  albuterol (VENTOLIN HFA) 108 (90 Base) MCG/ACT inhaler Inhale 1-2 puffs into the lungs every 6 (six) hours as needed for wheezing or shortness of breath. 12/13/21   Edwin Dada P, DO  cetirizine (ZYRTEC) 10 MG tablet Take 10 mg by mouth daily as needed for allergies.    [provider]  guaiFENesin-dextromethorphan (ROBITUSSIN DM) 100-10 MG/5ML syrup Take 10 mLs by mouth every 4 (four) hours as needed for cough. 12/13/21   Edwin Dada P, DO  ibuprofen (ADVIL) 600 MG tablet Take 1 tablet (600 mg total) by mouth every 6 (six) hours as needed for fever. 12/13/21   Edwin Dada P, DO  meloxicam (MOBIC) 7.5 MG tablet Take 1 tablet (7.5 mg total) by mouth daily. 04/01/21   Renne Crigler, PA-C  norethindrone-ethinyl estradiol-iron (MICROGESTIN FE,GILDESS  FE,LOESTRIN FE) 1.5-30 MG-MCG tablet Take 1 tablet by mouth daily.    [provider]      Allergies    Morphine and codeine    Review of Systems   Review of Systems  Constitutional:  Negative for activity change, appetite change and fever.  HENT:  Negative for congestion.   Respiratory:  Negative for cough, chest tightness and shortness of breath.   Gastrointestinal:  Negative for abdominal pain, nausea and vomiting.  Musculoskeletal:  Positive for arthralgias and myalgias.  Skin:  Negative for rash.  Neurological:  Negative for dizziness, weakness and headaches.   all other systems are negative except as noted in the HPI and PMH.    Physical Exam Updated Vital Signs BP (!) 143/90   Pulse 97   Temp 98 F (36.7 C)   Resp 18   Ht 5\' 1"  (1.549 m)   Wt 98.4 kg   SpO2 100%   BMI 41.00 kg/m  Physical Exam Vitals and nursing note reviewed.  Constitutional:      General: She is not in acute distress.    Appearance: She is well-developed.     Comments: Uncomfortable  HENT:     Head: Normocephalic and atraumatic.     Mouth/Throat:     Pharynx: No oropharyngeal exudate.  Eyes:     Conjunctiva/sclera: Conjunctivae normal.     Pupils: Pupils are equal, round, and reactive to light.  Neck:  Comments: No meningismus. Cardiovascular:     Rate and Rhythm: Normal rate and regular rhythm.     Heart sounds: Normal heart sounds. No murmur heard. Pulmonary:     Effort: Pulmonary effort is normal. No respiratory distress.     Breath sounds: Normal breath sounds.  Abdominal:     Palpations: Abdomen is soft.     Tenderness: There is no abdominal tenderness. There is no guarding or rebound.  Musculoskeletal:        General: No tenderness. Normal range of motion.     Cervical back: Normal range of motion and neck supple.     Comments: Full range of motion of all major joints including bilateral shoulders, elbows, wrist, knees and ankles.  Intact radial pulses bilaterally.   Intact DP and PT pulses bilaterally.  Compartments soft.  Skin:    General: Skin is warm.  Neurological:     Mental Status: She is alert and oriented to person, place, and time.     Cranial Nerves: No cranial nerve deficit.     Motor: No abnormal muscle tone.     Coordination: Coordination normal.     Comments:  5/5 strength throughout. CN 2-12 intact.Equal grip strength.   Psychiatric:        Behavior: Behavior normal.     ED Results / Procedures / Treatments   Labs (all labs ordered are listed, but only abnormal results are displayed) Labs Reviewed  CBC WITH DIFFERENTIAL/PLATELET - Abnormal; Notable for the following components:      Result Value   Hemoglobin 11.3 (*)    MCH 25.2 (*)    RDW 15.9 (*)    All other components within normal limits  BASIC METABOLIC PANEL - Abnormal; Notable for the following components:   Glucose, Bld 161 (*)    Creatinine, Ser 1.01 (*)    All other components within normal limits  SEDIMENTATION RATE - Abnormal; Notable for the following components:   Sed Rate 27 (*)    All other components within normal limits  CK - Abnormal; Notable for the following components:   Total CK 957 (*)    All other components within normal limits  SARS CORONAVIRUS 2 BY RT PCR  C-REACTIVE PROTEIN  TROPONIN I (HIGH SENSITIVITY)    EKG EKG Interpretation Date/Time:  Tuesday May 14 2023 04:09:16 EDT Ventricular Rate:  86 PR Interval:  138 QRS Duration:  76 QT Interval:  346 QTC Calculation: 414 R Axis:   83  Text Interpretation: Sinus rhythm Multiple ventricular premature complexes No significant change was found Confirmed by Glynn Octave [phone]) on 05/14/2023 4:48:41 AM  Radiology No results found.  Procedures Procedures    Medications Ordered in ED Medications  sodium chloride 0.9 % bolus 1,000 mL (has no administration in time range)  ketorolac (TORADOL) 30 MG/ML injection 15 mg (has no administration in time range)    ED Course/ Medical  Decision Making/ A&P                             Medical Decision Making Amount and/or Complexity of Data Reviewed Labs: ordered. Decision-making details documented in ED Course. Radiology: ordered and independent interpretation performed. Decision-making details documented in ED Course. ECG/medicine tests: ordered and independent interpretation performed. Decision-making details documented in ED Course.  Risk Prescription drug management.   Diffuse myalgias for the past day.  No fever.  Vital signs are stable.  No distress.  Will  hydrate, check labs and CK.  Hydration given.  Creatinine is normal.  Potassium is normal. CK mildly elevated at 957.  May be contributing to patient's symptoms.  Will send ESR and CRP.  There is underlying concern for possible rheumatological condition causing her myalgias and joint muscle pain.  Feels improved after Toradol and muscle relaxers in the ED.  Will hydrate aggressively to treat elevated CK.  Follow-up with PCP as well as rheumatology.       Final Clinical Impression(s) / ED Diagnoses Final diagnoses:  Myalgia    Rx / DC Orders ED Discharge Orders     None         Tenae Graziosi, Jeannett Senior, MD 05/14/23 [phone]

## 2023-05-14 NOTE — ED Triage Notes (Signed)
Pt c/o muscle and joint pain x a few days. Pt states the pain started in her right wrist then jumped to her left shoulder. States that currently the pain is her shoulders, bilateral wrists, bilateral knees, all ten toes, bilateral shins, and bilateral hands.

## 2023-05-14 NOTE — Discharge Instructions (Addendum)
As we discussed your muscle enzyme called CK is slightly high today. This may be contributing to your muscle cramps and pain. Keep yourself hydrated.  Follow-up with your primary doctor as well as the rheumatologist for further evaluation of your muscle pain and cramps.  Return to the ED with chest pain, shortness of breath, any other concerns.

## 2023-11-05 NOTE — Progress Notes (Deleted)
   Office Visit Note  Patient: Veronica Carroll             Date of Birth: 01-06-1981           MRN: [id number]             [facility] Referring: Earma Gloss, MD Visit Date: 11/06/2023 Occupation: @GUAROCC @  Subjective:  No chief complaint on file.   History of Present Illness: Veronica Carroll is a 43 y.o. female ***     Activities of Daily Living:  Patient reports morning stiffness for *** {minute/hour:19697}.   Patient {ACTIONS;DENIES/REPORTS:21021675::"Denies"} nocturnal pain.  Difficulty dressing/grooming: {ACTIONS;DENIES/REPORTS:21021675::"Denies"} Difficulty climbing stairs: {ACTIONS;DENIES/REPORTS:21021675::"Denies"} Difficulty getting out of chair: {ACTIONS;DENIES/REPORTS:21021675::"Denies"} Difficulty using hands for taps, buttons, cutlery, and/or writing: {ACTIONS;DENIES/REPORTS:21021675::"Denies"}  No Rheumatology ROS completed.   PMFS History:  Patient Active Problem List   Diagnosis Date Noted   History of gestational diabetes 05/07/2017   Postpartum care and examination 05/07/2017   Chronic back pain 01/09/2017    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(HCC)     Family History  Problem Relation Age of Onset   Diabetes Mother    Diabetes Maternal Grandmother    Diabetes Maternal Grandfather    Past Surgical History:  Procedure Laterality Date   FOOT SURGERY     THERAPEUTIC ABORTION     WISDOM TOOTH EXTRACTION     Social History   Social History Narrative   Not on file   There is no immunization history for the selected administration types on file for this patient.   Objective: Vital Signs: There were no vitals taken for this visit.   Physical Exam   Musculoskeletal Exam: ***  CDAI Exam: CDAI Score: -- Patient Global: --; Provider Global: -- Swollen: --; Tender: -- Joint Exam 11/06/2023   No joint exam has been documented for this visit   There is currently no information  documented on the homunculus. Go to the Rheumatology activity and complete the homunculus joint exam.  Investigation: No additional findings.  Imaging: No results found.  Recent Labs: Lab Results  Component Value Date   WBC 6.1 05/14/2023   HGB 11.3 (L) 05/14/2023   PLT 200 05/14/2023   NA 136 05/14/2023   K 3.7 05/14/2023   CL 104 05/14/2023   CO2 23 05/14/2023   GLUCOSE 161 (H) 05/14/2023   BUN 11 05/14/2023   CREATININE 1.01 (H) 05/14/2023   BILITOT 0.4 11/28/2018   ALKPHOS 75 11/28/2018   AST 28 11/28/2018   ALT 31 11/28/2018   PROT 8.0 11/28/2018   ALBUMIN 3.7 11/28/2018   CALCIUM 9.1 05/14/2023   GFRAA >60 11/28/2018    Speciality Comments: No specialty comments available.  Procedures:  No procedures performed Allergies: Morphine  and codeine   Assessment / Plan:     Visit Diagnoses: No diagnosis found.  Orders: No orders of the defined types were placed in this encounter.  No orders of the defined types were placed in this encounter.   Face-to-face time spent with patient was *** minutes. Greater than 50% of time was spent in counseling and coordination of care.  Follow-Up Instructions: No follow-ups on file.   Matt Song, MD  Note - This record has been created using [employer].  Chart creation errors have been sought, but may not always  have been located. Such creation errors do not reflect on  the standard of medical care.

## 2023-11-06 ENCOUNTER — Encounter: Payer: Non-veteran care | Admitting: Internal Medicine

## 2023-11-14 ENCOUNTER — Other Ambulatory Visit: Payer: Self-pay

## 2023-11-14 DIAGNOSIS — I10 Essential (primary) hypertension: Secondary | ICD-10-CM | POA: Diagnosis not present

## 2023-11-14 DIAGNOSIS — H538 Other visual disturbances: Secondary | ICD-10-CM | POA: Insufficient documentation

## 2023-11-14 DIAGNOSIS — Z5321 Procedure and treatment not carried out due to patient leaving prior to being seen by health care provider: Secondary | ICD-10-CM | POA: Diagnosis not present

## 2023-11-14 NOTE — ED Triage Notes (Signed)
Pt states she has checked her blood pressure at home because she has been having some blurry vision. Pt states she had home readings of 158/98 & 141/95. Pt denies hx of HTN.  Pt reports intermittent blurred vision for past 2 days. Pt denies any other S/S at time of triage.

## 2023-11-15 ENCOUNTER — Emergency Department (HOSPITAL_BASED_OUTPATIENT_CLINIC_OR_DEPARTMENT_OTHER)
Admission: EM | Admit: 2023-11-15 | Discharge: 2023-11-15 | Discharge status: Against Medical Advice | Payer: No Typology Code available for payment source | Attending: Emergency Medicine | Admitting: Emergency Medicine
